# Patient Record
Sex: Female | Born: 1965
Health system: Southern US, Community
[De-identification: ages and names within clinical notes are randomized; demographics above are authoritative.]

## PROBLEM LIST (undated history)

## (undated) DIAGNOSIS — I2510 Atherosclerotic heart disease of native coronary artery without angina pectoris: Secondary | ICD-10-CM

## (undated) DIAGNOSIS — E785 Hyperlipidemia, unspecified: Secondary | ICD-10-CM

## (undated) DIAGNOSIS — K635 Polyp of colon: Secondary | ICD-10-CM

## (undated) DIAGNOSIS — I10 Essential (primary) hypertension: Secondary | ICD-10-CM

## (undated) DIAGNOSIS — R7303 Prediabetes: Secondary | ICD-10-CM

## (undated) DIAGNOSIS — T4145XA Adverse effect of unspecified anesthetic, initial encounter: Secondary | ICD-10-CM

## (undated) DIAGNOSIS — H209 Unspecified iridocyclitis: Secondary | ICD-10-CM

## (undated) HISTORY — PX: OOPHORECTOMY: SHX86

## (undated) HISTORY — PX: BREAST REDUCTION SURGERY: SHX8

## (undated) HISTORY — DX: Essential (primary) hypertension: I10

## (undated) HISTORY — PX: TOTAL ABDOMINAL HYSTERECTOMY: SHX209

## (undated) HISTORY — PX: OTHER SURGICAL HISTORY: SHX169

## (undated) HISTORY — PX: HERNIA REPAIR: SHX51

## (undated) HISTORY — DX: Hyperlipidemia, unspecified: E78.5

## (undated) HISTORY — DX: Polyp of colon: K63.5

## (undated) HISTORY — PX: CATARACT EXTRACTION W/ INTRAOCULAR LENS IMPLANT: SHX1309

## (undated) HISTORY — DX: Atherosclerotic heart disease of native coronary artery without angina pectoris: I25.10

## (undated) HISTORY — PX: CHOLECYSTECTOMY: SHX55

## (undated) HISTORY — PX: EYE MUSCLE SURGERY: SHX370

---

## 1998-03-11 ENCOUNTER — Ambulatory Visit (HOSPITAL_COMMUNITY): Admission: RE | Admit: 1998-03-11 | Discharge: 1998-03-11 | Payer: Self-pay

## 1998-04-02 HISTORY — PX: REDUCTION MAMMAPLASTY: SUR839

## 1998-09-27 ENCOUNTER — Other Ambulatory Visit: Admission: RE | Admit: 1998-09-27 | Discharge: 1998-09-27 | Payer: Self-pay | Admitting: Obstetrics and Gynecology

## 1999-02-13 ENCOUNTER — Ambulatory Visit (HOSPITAL_BASED_OUTPATIENT_CLINIC_OR_DEPARTMENT_OTHER): Admission: RE | Admit: 1999-02-13 | Discharge: 1999-02-14 | Payer: Self-pay | Admitting: Specialist

## 1999-12-25 ENCOUNTER — Ambulatory Visit (HOSPITAL_BASED_OUTPATIENT_CLINIC_OR_DEPARTMENT_OTHER): Admission: RE | Admit: 1999-12-25 | Discharge: 1999-12-26 | Payer: Self-pay | Admitting: Specialist

## 2000-01-19 ENCOUNTER — Encounter: Payer: Self-pay | Admitting: Gastroenterology

## 2000-01-19 ENCOUNTER — Ambulatory Visit (HOSPITAL_COMMUNITY): Admission: RE | Admit: 2000-01-19 | Discharge: 2000-01-19 | Payer: Self-pay | Admitting: Gastroenterology

## 2000-02-01 ENCOUNTER — Ambulatory Visit (HOSPITAL_COMMUNITY): Admission: RE | Admit: 2000-02-01 | Discharge: 2000-02-01 | Payer: Self-pay | Admitting: Internal Medicine

## 2000-02-01 ENCOUNTER — Encounter: Payer: Self-pay | Admitting: Internal Medicine

## 2000-02-02 ENCOUNTER — Ambulatory Visit (HOSPITAL_COMMUNITY): Admission: RE | Admit: 2000-02-02 | Discharge: 2000-02-02 | Payer: Self-pay | Admitting: Internal Medicine

## 2000-02-02 ENCOUNTER — Encounter: Payer: Self-pay | Admitting: Internal Medicine

## 2000-05-02 ENCOUNTER — Encounter: Payer: Self-pay | Admitting: Internal Medicine

## 2000-05-02 ENCOUNTER — Ambulatory Visit (HOSPITAL_COMMUNITY): Admission: RE | Admit: 2000-05-02 | Discharge: 2000-05-02 | Payer: Self-pay | Admitting: Internal Medicine

## 2000-05-31 ENCOUNTER — Ambulatory Visit (HOSPITAL_BASED_OUTPATIENT_CLINIC_OR_DEPARTMENT_OTHER): Admission: RE | Admit: 2000-05-31 | Discharge: 2000-05-31 | Payer: Self-pay | Admitting: Surgery

## 2000-12-10 ENCOUNTER — Encounter: Payer: Self-pay | Admitting: Internal Medicine

## 2000-12-10 ENCOUNTER — Ambulatory Visit (HOSPITAL_COMMUNITY): Admission: RE | Admit: 2000-12-10 | Discharge: 2000-12-10 | Payer: Self-pay | Admitting: Internal Medicine

## 2001-01-03 ENCOUNTER — Other Ambulatory Visit: Admission: RE | Admit: 2001-01-03 | Discharge: 2001-01-03 | Payer: Self-pay | Admitting: *Deleted

## 2001-01-27 ENCOUNTER — Encounter: Payer: Self-pay | Admitting: *Deleted

## 2001-01-28 ENCOUNTER — Inpatient Hospital Stay (HOSPITAL_COMMUNITY): Admission: RE | Admit: 2001-01-28 | Discharge: 2001-01-30 | Payer: Self-pay | Admitting: *Deleted

## 2001-05-13 ENCOUNTER — Encounter: Payer: Self-pay | Admitting: Urology

## 2001-05-13 ENCOUNTER — Ambulatory Visit (HOSPITAL_COMMUNITY): Admission: RE | Admit: 2001-05-13 | Discharge: 2001-05-13 | Payer: Self-pay | Admitting: Urology

## 2001-05-30 ENCOUNTER — Ambulatory Visit (HOSPITAL_BASED_OUTPATIENT_CLINIC_OR_DEPARTMENT_OTHER): Admission: RE | Admit: 2001-05-30 | Discharge: 2001-05-30 | Payer: Self-pay | Admitting: Urology

## 2001-09-08 ENCOUNTER — Ambulatory Visit (HOSPITAL_COMMUNITY): Admission: RE | Admit: 2001-09-08 | Discharge: 2001-09-08 | Payer: Self-pay | Admitting: Obstetrics and Gynecology

## 2001-09-08 ENCOUNTER — Encounter: Payer: Self-pay | Admitting: Obstetrics and Gynecology

## 2012-04-02 DIAGNOSIS — T8859XA Other complications of anesthesia, initial encounter: Secondary | ICD-10-CM

## 2012-04-02 HISTORY — DX: Other complications of anesthesia, initial encounter: T88.59XA

## 2012-12-24 ENCOUNTER — Ambulatory Visit: Payer: Self-pay | Admitting: Internal Medicine

## 2013-01-13 ENCOUNTER — Ambulatory Visit: Payer: Self-pay | Admitting: Obstetrics and Gynecology

## 2013-02-06 ENCOUNTER — Inpatient Hospital Stay: Payer: Self-pay | Admitting: Obstetrics and Gynecology

## 2013-02-06 LAB — BASIC METABOLIC PANEL
Anion Gap: 4 — ABNORMAL LOW (ref 7–16)
BUN: 11 mg/dL (ref 7–18)
Calcium, Total: 8.4 mg/dL — ABNORMAL LOW (ref 8.5–10.1)
Chloride: 105 mmol/L (ref 98–107)
Co2: 29 mmol/L (ref 21–32)
EGFR (African American): >60
EGFR (Non-African Amer.): >60
Glucose: 99 mg/dL (ref 65–99)
Sodium: 138 mmol/L (ref 136–145)

## 2013-02-06 LAB — CBC
HCT: 42.4 % (ref 35.0–47.0)
MCHC: 35 g/dL (ref 32.0–36.0)
MCV: 90 fL (ref 80–100)
Platelet: 271 10*3/uL (ref 150–440)

## 2013-02-07 LAB — BASIC METABOLIC PANEL
Anion Gap: 3 — ABNORMAL LOW (ref 7–16)
Calcium, Total: 8.1 mg/dL — ABNORMAL LOW (ref 8.5–10.1)
EGFR (African American): >60
EGFR (Non-African Amer.): >60
Potassium: 3.7 mmol/L (ref 3.5–5.1)

## 2013-02-07 LAB — CBC WITH DIFFERENTIAL/PLATELET
Basophil #: 0 10*3/uL (ref 0.0–0.1)
Basophil %: 0.3 %
Eosinophil #: 0 10*3/uL (ref 0.0–0.7)
Eosinophil %: 0.1 %
HGB: 13.1 g/dL (ref 12.0–16.0)
Lymphocyte #: 1.1 10*3/uL (ref 1.0–3.6)
MCHC: 34.6 g/dL (ref 32.0–36.0)
MCV: 90 fL (ref 80–100)
Monocyte #: 1.7 x10 3/mm — ABNORMAL HIGH (ref 0.2–0.9)
Neutrophil %: 81.7 %
RDW: 12.9 % (ref 11.5–14.5)
WBC: 15.9 10*3/uL — ABNORMAL HIGH (ref 3.6–11.0)

## 2013-02-17 ENCOUNTER — Inpatient Hospital Stay: Payer: Self-pay | Admitting: Surgery

## 2013-02-17 LAB — BASIC METABOLIC PANEL
Anion Gap: 8 (ref 7–16)
Calcium, Total: 8.9 mg/dL (ref 8.5–10.1)
Chloride: 95 mmol/L — ABNORMAL LOW (ref 98–107)
Co2: 33 mmol/L — ABNORMAL HIGH (ref 21–32)
Creatinine: 0.92 mg/dL (ref 0.60–1.30)
EGFR (African American): >60
Glucose: 147 mg/dL — ABNORMAL HIGH (ref 65–99)
Potassium: 2.7 mmol/L — ABNORMAL LOW (ref 3.5–5.1)
Sodium: 136 mmol/L (ref 136–145)

## 2013-02-17 LAB — CBC WITH DIFFERENTIAL/PLATELET
Basophil %: 0.8 %
Eosinophil %: 1.1 %
HCT: 39.7 % (ref 35.0–47.0)
Lymphocyte #: 1.6 10*3/uL (ref 1.0–3.6)
Lymphocyte %: 7 %
MCHC: 34.4 g/dL (ref 32.0–36.0)
Monocyte #: 1.7 x10 3/mm — ABNORMAL HIGH (ref 0.2–0.9)
Monocyte %: 7.2 %
Neutrophil %: 83.9 %
Platelet: 476 10*3/uL — ABNORMAL HIGH (ref 150–440)
RBC: 4.45 10*6/uL (ref 3.80–5.20)
RDW: 12.9 % (ref 11.5–14.5)
WBC: 23.1 10*3/uL — ABNORMAL HIGH (ref 3.6–11.0)

## 2013-02-17 LAB — URINALYSIS, COMPLETE
Glucose,UR: NEGATIVE mg/dL (ref 0–75)
Granular Cast: 2
Hyaline Cast: 35
Nitrite: NEGATIVE
Protein: 100
RBC,UR: 5 /HPF (ref 0–5)
Squamous Epithelial: 29
Transitional Epi: 2

## 2013-02-18 LAB — CBC WITH DIFFERENTIAL/PLATELET
Basophil #: 0.1 10*3/uL (ref 0.0–0.1)
Eosinophil #: 0.4 10*3/uL (ref 0.0–0.7)
HCT: 32.6 % — ABNORMAL LOW (ref 35.0–47.0)
HGB: 11.2 g/dL — ABNORMAL LOW (ref 12.0–16.0)
Lymphocyte #: 1.8 10*3/uL (ref 1.0–3.6)
MCH: 30.6 pg (ref 26.0–34.0)
MCV: 89 fL (ref 80–100)
Monocyte #: 1.7 x10 3/mm — ABNORMAL HIGH (ref 0.2–0.9)
Monocyte %: 9.9 %
Platelet: 378 10*3/uL (ref 150–440)
RDW: 13.2 % (ref 11.5–14.5)

## 2013-02-18 LAB — POTASSIUM: Potassium: 2.5 mmol/L — CL (ref 3.5–5.1)

## 2013-02-19 LAB — POTASSIUM: Potassium: 3.6 mmol/L (ref 3.5–5.1)

## 2013-02-19 LAB — URINE CULTURE

## 2013-02-21 LAB — BASIC METABOLIC PANEL
Anion Gap: 0 — ABNORMAL LOW (ref 7–16)
Anion Gap: 7 (ref 7–16)
BUN: 7 mg/dL (ref 7–18)
Calcium, Total: 7.2 mg/dL — ABNORMAL LOW (ref 8.5–10.1)
Chloride: 107 mmol/L (ref 98–107)
Co2: 25 mmol/L (ref 21–32)
Creatinine: 0.51 mg/dL — ABNORMAL LOW (ref 0.60–1.30)
Creatinine: 0.54 mg/dL — ABNORMAL LOW (ref 0.60–1.30)
EGFR (African American): >60
EGFR (African American): >60
EGFR (Non-African Amer.): >60
EGFR (Non-African Amer.): >60
Glucose: 82 mg/dL (ref 65–99)
Osmolality: 275 (ref 275–301)
Potassium: 7.8 mmol/L (ref 3.5–5.1)
Potassium: 4.2 mmol/L (ref 3.5–5.1)
Sodium: 132 mmol/L — ABNORMAL LOW (ref 136–145)

## 2013-02-21 LAB — PLATELET COUNT: Platelet: 419 10*3/uL (ref 150–440)

## 2013-03-25 ENCOUNTER — Ambulatory Visit: Payer: Self-pay | Admitting: Surgery

## 2013-05-16 DIAGNOSIS — L7682 Other postprocedural complications of skin and subcutaneous tissue: Secondary | ICD-10-CM | POA: Insufficient documentation

## 2013-09-23 ENCOUNTER — Ambulatory Visit: Payer: Self-pay | Admitting: Internal Medicine

## 2013-11-13 DIAGNOSIS — K432 Incisional hernia without obstruction or gangrene: Secondary | ICD-10-CM | POA: Insufficient documentation

## 2014-02-03 ENCOUNTER — Ambulatory Visit: Payer: Self-pay | Admitting: Internal Medicine

## 2014-04-20 ENCOUNTER — Ambulatory Visit: Payer: Self-pay | Admitting: Cardiovascular Disease

## 2014-05-06 ENCOUNTER — Encounter: Payer: Self-pay | Admitting: Cardiovascular Disease

## 2014-05-06 ENCOUNTER — Ambulatory Visit (INDEPENDENT_AMBULATORY_CARE_PROVIDER_SITE_OTHER): Payer: 59 | Admitting: Cardiovascular Disease

## 2014-05-06 ENCOUNTER — Encounter (INDEPENDENT_AMBULATORY_CARE_PROVIDER_SITE_OTHER): Payer: Self-pay

## 2014-05-06 VITALS — BP 132/84 | HR 67 | Ht 64.0 in | Wt 211.0 lb

## 2014-05-06 DIAGNOSIS — R079 Chest pain, unspecified: Secondary | ICD-10-CM | POA: Insufficient documentation

## 2014-05-06 DIAGNOSIS — Z01812 Encounter for preprocedural laboratory examination: Secondary | ICD-10-CM

## 2014-05-06 DIAGNOSIS — I10 Essential (primary) hypertension: Secondary | ICD-10-CM | POA: Insufficient documentation

## 2014-05-06 DIAGNOSIS — E785 Hyperlipidemia, unspecified: Secondary | ICD-10-CM | POA: Insufficient documentation

## 2014-05-06 NOTE — Assessment & Plan Note (Signed)
Recent lipid profile was not optimal and Crestor. Recommend a target LDL of less than 100 and if she does have obstructive coronary artery disease recommend a target of less than 70.

## 2014-05-06 NOTE — Progress Notes (Signed)
Primary care physician:Dr. Masoud.   HPI  This is a pleasant 49 year old female who was referred for evaluation of chest pain. She has no previous cardiac history. She has known history of hypertension, hyperlipidemia, borderline diabetes and strong family history of premature coronary artery disease. She has been having intermittent chest pain over the last few weeks described as tightness feeling both at rest and with physical activities. This has been associated with significant exertional dyspnea. She underwent a stress echocardiogram. However, this was limited by hypertensive response to exercise with a blood pressure of 200/100. She also had significant limiting chest tightness. Peak heart rate was 106 only. Echo images showed normal LV systolic function with no evidence of ischemia at low level of exercise.  Allergies  Allergen Reactions  . Ceftriaxone Rash  . Hydromorphone Rash  . Latex Rash  . Olmesartan Palpitations     No current outpatient prescriptions on file prior to visit.   No current facility-administered medications on file prior to visit.     Past Medical History  Diagnosis Date  . Hypertension   . Hyperlipidemia      Past Surgical History  Procedure Laterality Date  . Cesarean section      x3  . Abdominal plasty    . Breast reduction surgery    . Total abdominal hysterectomy    . Hernia repair    . Oophorectomy    . Eye muscle surgery    . Cholecystectomy       Family History  Problem Relation Age of Onset  . Heart disease Mother   . Heart attack Mother   . Pulmonary embolism Mother   . Heart attack Maternal Uncle   . Heart disease Maternal Uncle   . Heart attack Paternal Uncle   . Heart attack Maternal Uncle   . Heart disease Maternal Uncle   . Heart attack Maternal Uncle   . Heart disease Maternal Uncle   . Heart attack Maternal Uncle   . Heart disease Maternal Uncle   . Heart attack Maternal Uncle   . Heart disease Maternal Uncle     . Heart attack Maternal Uncle   . Heart disease Maternal Uncle      History   Social History  . Marital Status: Divorced    Spouse Name: N/A    Number of Children: N/A  . Years of Education: N/A   Occupational History  . Not on file.   Social History Main Topics  . Smoking status: Never Smoker   . Smokeless tobacco: Not on file  . Alcohol Use: No  . Drug Use: No  . Sexual Activity: Not on file   Other Topics Concern  . Not on file   Social History Narrative  . No narrative on file     ROS A 10 point review of system was performed. It is negative other than that mentioned in the history of present illness.  PHYSICAL EXAM   BP 132/84 mmHg  Pulse 67  Ht 5\' 4"  (1.626 m)  Wt 211 lb (95.709 kg)  BMI 36.20 kg/m2 Constitutional: She is oriented to person, place, and time. She appears well-developed and well-nourished. No distress.  HENT: No nasal discharge.  Head: Normocephalic and atraumatic.  Eyes: Pupils are equal and round. No discharge.  Neck: Normal range of motion. Neck supple. No JVD present. No thyromegaly present.  Cardiovascular: Normal rate, regular rhythm, normal heart sounds. Exam reveals no gallop and no friction rub. No murmur heard.  Pulmonary/Chest: Effort normal and breath sounds normal. No stridor. No respiratory distress. She has no wheezes. She has no rales. She exhibits no tenderness.  Abdominal: Soft. Bowel sounds are normal. She exhibits no distension. There is no tenderness. There is no rebound and no guarding.  Musculoskeletal: Normal range of motion. She exhibits no edema and no tenderness.  Neurological: She is alert and oriented to person, place, and time. Coordination normal.  Skin: Skin is warm and dry. No rash noted. She is not diaphoretic. No erythema. No pallor.  Psychiatric: She has a normal mood and affect. Her behavior is normal. Judgment and thought content normal.     FUW:TKTCC  Rhythm  WITHIN NORMAL LIMITS   ASSESSMENT  AND PLAN

## 2014-05-06 NOTE — Patient Instructions (Addendum)
[facility] Cardiac Cath Instructions   You are scheduled for a Cardiac Cath on:_______2/5/16__________________  Please arrive at _0730______am on the day of your procedure  You will need to pre-register prior to the day of your procedure.  Enter through the [employer] at [facility].  Registration is the first desk on your right.  Please take the procedure order we have given you in order to be registered appropriately  Do not eat/drink anything after midnight  Someone will need to drive you home  It is recommended someone be with you for the first 24 hours after your procedure  Wear clothes that are easy to get on/off and wear slip on shoes if possible   Medications bring a current list of all medications with you  __x_ You may take all of your medications the morning of your procedure with enough water to swallow safely    Day of your procedure: Arrive at the [place] entrance.  Free valet service is available.  After entering the [place] please check-in at the registration desk (1st desk on your right) to receive your armband. After receiving your armband someone will escort you to the cardiac cath/special procedures waiting area.  The usual length of stay after your procedure is about 2 to 3 hours.  This can vary.  If you have any questions, please call our office at [phone], or you may call the cardiac cath lab at [facility] directly at [phone]   Your physician recommends that you have labs today: CBC  BMP INR

## 2014-05-06 NOTE — Assessment & Plan Note (Signed)
The patient's symptoms of exertional chest tightness and shortness of breath are worrisome for class III angina in spite of treatment with a beta blocker. Recent stress echocardiogram was unremarkable. However, this was suboptimal due to low peak heart rate. The stress test was limited by chest pain and hypertensive response to exercise. Given current symptoms and multiple risk factors for coronary artery disease, I believe the best option is proceeding with cardiac catheterization and possible coronary intervention. Risks, benefits and alternatives were discussed with the patient.

## 2014-05-06 NOTE — Assessment & Plan Note (Signed)
Blood pressure is reasonably controlled on Toprol.

## 2014-05-07 ENCOUNTER — Ambulatory Visit: Payer: Self-pay | Admitting: Cardiovascular Disease

## 2014-05-07 ENCOUNTER — Telehealth: Payer: Self-pay | Admitting: *Deleted

## 2014-05-07 DIAGNOSIS — I2510 Atherosclerotic heart disease of native coronary artery without angina pectoris: Secondary | ICD-10-CM

## 2014-05-07 HISTORY — PX: CARDIAC CATHETERIZATION: SHX172

## 2014-05-07 LAB — CBC WITH DIFFERENTIAL/PLATELET
Basophils Absolute: 0 10*3/uL (ref 0.0–0.2)
Basos: 0 %
Eos: 3 %
Eosinophils Absolute: 0.3 10*3/uL (ref 0.0–0.4)
HCT: 41.9 % (ref 34.0–46.6)
Hemoglobin: 14.4 g/dL (ref 11.1–15.9)
Immature Grans (Abs): 0 10*3/uL (ref 0.0–0.1)
Immature Granulocytes: 0 %
Lymphocytes Absolute: 2.5 10*3/uL (ref 0.7–3.1)
Lymphs: 24 %
MCH: 30.7 pg (ref 26.6–33.0)
MCHC: 34.4 g/dL (ref 31.5–35.7)
MCV: 89 fL (ref 79–97)
Monocytes Absolute: 0.9 10*3/uL (ref 0.1–0.9)
Monocytes: 9 %
Neutrophils Absolute: 6.7 10*3/uL (ref 1.4–7.0)
Neutrophils Relative %: 64 %
Platelets: 310 10*3/uL (ref 150–379)
RBC: 4.69 x10E6/uL (ref 3.77–5.28)
RDW: 14.6 % (ref 12.3–15.4)
WBC: 10.5 10*3/uL (ref 3.4–10.8)

## 2014-05-07 LAB — PROTIME-INR
INR: 1 (ref 0.8–1.2)
Prothrombin Time: 10.1 s (ref 9.1–12.0)

## 2014-05-07 LAB — BASIC METABOLIC PANEL
BUN/Creatinine Ratio: 17 (ref 9–23)
BUN: 11 mg/dL (ref 6–24)
CO2: 21 mmol/L (ref 18–29)
Calcium: 9.6 mg/dL (ref 8.7–10.2)
Chloride: 106 mmol/L (ref 97–108)
Creatinine, Ser: 0.66 mg/dL (ref 0.57–1.00)
GFR calc Af Amer: 120 mL/min/{1.73_m2} (ref >59)
GFR calc non Af Amer: 104 mL/min/{1.73_m2} (ref >59)
Glucose: 117 mg/dL — ABNORMAL HIGH (ref 65–99)
Potassium: 4.8 mmol/L (ref 3.5–5.2)
Sodium: 142 mmol/L (ref 134–144)

## 2014-05-07 MED ORDER — AMLODIPINE BESYLATE 2.5 MG PO TABS: 2.5000 mg | ORAL_TABLET | Freq: Every day | ORAL | Status: DC

## 2014-05-07 NOTE — Telephone Encounter (Signed)
[place] calling stating that Dr Fletcher Anon did cath on this patient and stated to patient that he wants her to start amlodipine  But the question is,  Is he going to send it in, or will she get it when he see's her. She is more confused.  Dr Fletcher Anon did not say when or how would patient start.   Please call Juliann Pulse back.

## 2014-05-07 NOTE — Telephone Encounter (Signed)
LVM to inform patient Dr. Fletcher Anon sent Norvasc 2.5 mg once daily to her pharmacy to be filled

## 2014-05-24 ENCOUNTER — Encounter: Payer: 59 | Admitting: Cardiovascular Disease

## 2014-05-24 ENCOUNTER — Encounter: Payer: Self-pay | Admitting: Cardiovascular Disease

## 2014-06-03 ENCOUNTER — Ambulatory Visit (INDEPENDENT_AMBULATORY_CARE_PROVIDER_SITE_OTHER): Payer: 59 | Admitting: Cardiovascular Disease

## 2014-06-03 ENCOUNTER — Encounter: Payer: Self-pay | Admitting: Cardiovascular Disease

## 2014-06-03 VITALS — BP 118/82 | HR 77 | Ht 64.0 in | Wt 213.2 lb

## 2014-06-03 DIAGNOSIS — E785 Hyperlipidemia, unspecified: Secondary | ICD-10-CM

## 2014-06-03 DIAGNOSIS — I25118 Atherosclerotic heart disease of native coronary artery with other forms of angina pectoris: Secondary | ICD-10-CM

## 2014-06-03 DIAGNOSIS — I2510 Atherosclerotic heart disease of native coronary artery without angina pectoris: Secondary | ICD-10-CM | POA: Insufficient documentation

## 2014-06-03 DIAGNOSIS — Z9889 Other specified postprocedural states: Secondary | ICD-10-CM

## 2014-06-03 DIAGNOSIS — I10 Essential (primary) hypertension: Secondary | ICD-10-CM

## 2014-06-03 MED ORDER — CARVEDILOL 3.125 MG PO TABS: 3.1250 mg | ORAL_TABLET | Freq: Two times a day (BID) | ORAL | Status: DC

## 2014-06-03 NOTE — Progress Notes (Signed)
Primary care physician:Dr. Masoud.   HPI  This is a pleasant 49 year old female who is here today for a follow-up visit regarding chest pain and coronary artery disease. She has known history of hypertension, hyperlipidemia, borderline diabetes and strong family history of premature coronary artery disease. She was seen recently for intermittent chest pain  both at rest and with physical activities.  She underwent a stress echocardiogram. However, this was limited by hypertensive response to exercise with a blood pressure of 200/100. She also had significant limiting chest tightness. Peak heart rate was 106 only. Echo images showed normal LV systolic function with no evidence of ischemia at low level of exercise. I proceeded with cardiac catheterization via the right radial which showed an 80% discrete mid LAD stenosis at the origin of first diagonal branch. The LAD was very small after the diagonal branch less than 2.5 mm. Ejection fraction was 65% with mildly elevated left ventricular end-diastolic pressure. I recommended medical therapy. I added amlodipine with subsequent improvement in blood pressure. She reports feeling tired and fatigued when she takes Toprol. Otherwise she denies recurrent chest pain.   Allergies  Allergen Reactions  . Ceftriaxone Rash  . Hydromorphone Rash  . Latex Rash  . Olmesartan Palpitations     Current Outpatient Prescriptions on File Prior to Visit  Medication Sig Dispense Refill  . amLODipine (NORVASC) 2.5 MG tablet Take 1 tablet (2.5 mg total) by mouth daily. 30 tablet 6  . aspirin EC 81 MG tablet Take 81 mg by mouth daily.     . Multiple Vitamin (MULTI-VITAMINS) TABS Take 1 tablet by mouth daily.     Marland Kitchen omeprazole (PRILOSEC) 20 MG capsule Take 1 capsule by mouth daily.     . rosuvastatin (CRESTOR) 10 MG tablet Take 10 mg by mouth daily.    Marland Kitchen zolpidem (AMBIEN) 5 MG tablet Take 5 mg by mouth as needed.     No current facility-administered medications on  file prior to visit.     Past Medical History  Diagnosis Date  . Hypertension   . Hyperlipidemia   . Coronary artery disease     Cardiac catheterization in February 2016 showed an 80% discrete mid LAD stenosis at the origin of first diagonal branch. The LAD was very small after the diagonal branch less than 2.5 mm. Ejection fraction was 65% with mildly elevated left ventricular end-diastolic pressure.     Past Surgical History  Procedure Laterality Date  . Cesarean section      x3  . Abdominal plasty    . Breast reduction surgery    . Total abdominal hysterectomy    . Hernia repair    . Oophorectomy    . Eye muscle surgery    . Cholecystectomy    . Cardiac catheterization  05/07/2014    [facility]     Family History  Problem Relation Age of Onset  . Heart disease Mother   . Heart attack Mother   . Pulmonary embolism Mother   . Heart attack Maternal Uncle   . Heart disease Maternal Uncle   . Heart attack Paternal Uncle   . Heart attack Maternal Uncle   . Heart disease Maternal Uncle   . Heart attack Maternal Uncle   . Heart disease Maternal Uncle   . Heart attack Maternal Uncle   . Heart disease Maternal Uncle   . Heart attack Maternal Uncle   . Heart disease Maternal Uncle   . Heart attack Maternal Uncle   .  Heart disease Maternal Uncle      History   Social History  . Marital Status: Divorced    Spouse Name: N/A  . Number of Children: N/A  . Years of Education: N/A   Occupational History  . Not on file.   Social History Main Topics  . Smoking status: Never Smoker   . Smokeless tobacco: Not on file  . Alcohol Use: No  . Drug Use: No  . Sexual Activity: Not on file   Other Topics Concern  . Not on file   Social History Narrative     ROS A 10 point review of system was performed. It is negative other than that mentioned in the history of present illness.  PHYSICAL EXAM   BP 118/82 mmHg  Pulse 77  Ht 5\' 4"  (1.626 m)  Wt 213 lb 4 oz (96.73 kg)   BMI 36.59 kg/m2 Constitutional: She is oriented to person, place, and time. She appears well-developed and well-nourished. No distress.  HENT: No nasal discharge.  Head: Normocephalic and atraumatic.  Eyes: Pupils are equal and round. No discharge.  Neck: Normal range of motion. Neck supple. No JVD present. No thyromegaly present.  Cardiovascular: Normal rate, regular rhythm, normal heart sounds. Exam reveals no gallop and no friction rub. No murmur heard.  Pulmonary/Chest: Effort normal and breath sounds normal. No stridor. No respiratory distress. She has no wheezes. She has no rales. She exhibits no tenderness.  Abdominal: Soft. Bowel sounds are normal. She exhibits no distension. There is no tenderness. There is no rebound and no guarding.  Musculoskeletal: Normal range of motion. She exhibits no edema and no tenderness.  Neurological: She is alert and oriented to person, place, and time. Coordination normal.  Skin: Skin is warm and dry. No rash noted. She is not diaphoretic. No erythema. No pallor.  Psychiatric: She has a normal mood and affect. Her behavior is normal. Judgment and thought content normal.     WNU:UVOZD  Rhythm  WITHIN NORMAL LIMITS   ASSESSMENT AND PLAN

## 2014-06-03 NOTE — Assessment & Plan Note (Signed)
Blood pressure is now well controlled on current medications. Toprol was switched to carvedilol as outlined above.

## 2014-06-03 NOTE — Assessment & Plan Note (Signed)
Cardiac catheterization showed one-vessel coronary artery disease involving the mid LAD which was of small caliber after the stenosis. I recommended attempted medical therapy. Chest pain resolved after adding amlodipine. Given the reported fatigue with Toprol, I switched her to carvedilol 3.125 mg twice daily.

## 2014-06-03 NOTE — Patient Instructions (Signed)
Stop Metoprolol.  Start Carvedilol 3.125 mg twice daily.   Your physician wants you to follow-up in: 6 months.  You will receive a reminder letter in the mail two months in advance. If you don't receive a letter, please call our office to schedule the follow-up appointment.

## 2014-06-03 NOTE — Assessment & Plan Note (Signed)
Continue treatment with rosuvastatin with a target LDL of less than 70. 

## 2014-06-24 ENCOUNTER — Telehealth: Payer: Self-pay | Admitting: *Deleted

## 2014-06-24 NOTE — Telephone Encounter (Signed)
Patient stated that she does not think she can take the Coreg  She is getting side effects, giving her light chest pains She states she does not want to be on a betablocker She is asking if we can change the medication.  Also states she has gained 16 pounds  LVM to inform patient that I received her message

## 2014-06-24 NOTE — Telephone Encounter (Signed)
Pt calling stating that the coreg that doctor put her on she can't take it She is getting side effects, giving her light chest pains   She states she does not want to be on a betablocker She says she does not have any  She is asking if we can change the medication.  Works for Dr Dellia Beckwith  Stated since he did not know where the blockage is, that dr Fletcher Anon would be better at changing the medication.  Also states she has gained 16 pounds

## 2014-06-28 NOTE — Telephone Encounter (Signed)
Informed patient of Dr. Jacklynn Ganong response  Patient will monitor blood pressure and call with results

## 2014-06-28 NOTE — Telephone Encounter (Signed)
Stop Coreg. Continue other medications. Monitor BP. If BP goes up , we can increase Amlodipine.

## 2014-07-23 NOTE — H&P (Signed)
PATIENT NAME:  Beverly Washington, Beverly Washington MR#:  [id number] DATE OF BIRTH:  [birth year]  DATE OF ADMISSION:  02/17/2013  CHIEF COMPLAINT: Abdominal wall infection, status post laparoscopic adhesiolysis and minilaparotomy.   HISTORY OF PRESENT ILLNESS: The patient is a 49 year old white female, para 3-0-1-3, with past history of multiple abdominal wall surgeries, who presents 1 week postoperative from laparoscopic adhesiolysis and minilaparotomy with LSO for suspected ovarian torsion, who presents now for aggressive antibiotic therapy for abdominal wall infection.   The patient had significant history notable for cesarean sections x3, total abdominal hysterectomy, bilateral tubal ligation, abdominoplasty and 2 hernia repairs with mesh in the past. Over the past 72 hours, the patient had developed drainage from her abdominal incision which was brownish, serous and malodorous. She denies fevers, chills or sweats. She denies nausea, vomiting, diarrhea. A white blood cell count obtained on November 17 was 18.4. On evaluation today, the patient appears to have more induration of her abdominal wall along with the persistent malodorous drainage. The patient remains afebrile. She is now admitted for IV antibiotic therapy as well as surgical consultation for assessment for the abdominal wall infection due to the patient's past history of having mesh placed for hernia repairs. Dr. Chauncey Reading previously assisted on the surgery 1 week ago. See his note for details.   PAST MEDICAL HISTORY:  1. Hypertension.  2. Obesity.   PAST SURGICAL HISTORY:  1. Cesarean section in 1984, 1988, 1994.  2. Cholecystectomy in 1989.  3. Tubal ligation in 1994.  4. Total abdominal hysterectomy in 2000. 5. Bladder sling in 2001.  6. Hernia repair in 2004.   PAST OBSTETRIC HISTORY: Para 3-0-1-3.   FAMILY HISTORY: Negative for cancer of the breast, colon or ovary.    SOCIAL HISTORY: The patient does not smoke, does not drink, does not  use drugs.  CURRENT MEDICATIONS:  1. Hydrochlorothiazide 12.5 mg daily.  2. Lisinopril 10 mg daily. 3. Dilaudid 2 to 4 mg q.6 hours p.r.n. pain. 4. Motrin 800 mg t.i.d.  5. Colace 100 mg b.i.d.   DRUG ALLERGIES: None.   PHYSICAL EXAMINATION:  GENERAL: The patient is a pleasant well-appearing white female in no acute distress. She is alert and oriented.  OROPHARYNX: Clear.  NECK: Supple, without thyromegaly or adenopathy.  LUNGS: Clear.  HEART: Regular rate and rhythm without murmur.  ABDOMEN: Soft. Mildly tender in the region of her midline incision. There is a midline incision that is notable for a defect in the inferior third of her incision measuring approximately 3 cm. The wound was packed. There is an area of induration approximately 6 x 8 cm to the lateral aspect of the wound. This is of the subcutaneous tissues and fascia. On probing, there is no pocket of fluid on the right side of the abdomen. There is a small tract noted on the left side of the wound on probing with Q-tip. There is erythema peri-incisionally.  BACK: Without CVA tenderness.  PELVIC: Exam is deferred.  EXTREMITIES: Without clubbing, cyanosis or edema.   IMPRESSION:  1. One week status post laparoscopic adhesiolysis and exploratory laparotomy with left salpingo-oophorectomy for suspected ovarian torsion. Pathology was notable for a simple cyst. 2. Postoperative abdominal wall wound infection.  3. History of surgical mesh used for hernia repair.   PLAN: 1. Admit for IV antibiotic therapy. Triple antibiotics with ampicillin, gentamicin and clindamycin will be started.  2. [facility] is consulted for assessment of wound ASAP today.  3. CBC, basic metabolic panel ordered.  ____________________________ Alanda Slim Penda Venturi, MD mad:lb D: 02/17/2013 12:32:39 ET T: 02/17/2013 12:45:21 ET JOB#: [id number]  cc: Hassell Done A. Amer Alcindor, MD, <Dictator> [facility] Alanda Slim Amarii Bordas MD ELECTRONICALLY  SIGNED 02/17/2013 22:07

## 2014-07-23 NOTE — Consult Note (Signed)
PATIENT NAME:  Beverly Washington, Beverly Washington MR#:  [id number] DATE OF BIRTH:  06/25/1965  DATE OF CONSULTATION:  03/25/2013  REFERRING PHYSICIAN:   CONSULTING PHYSICIAN:  Rodena Goldmann III, MD  PRIMARY COMPLAINT: Abdominal pain with drainage from her previous wound.   BRIEF HISTORY: [name] is a 49 year old woman who underwent a minilaparotomy, lysis of adhesions and ovarian cyst drainage procedure by the gynecology service in early November of 2014. The procedure was complicated by an extensive dissection and some mild bleeding. The general surgery service was consulted. Dr. Chauncey Reading assisted in her operative management.  She had had a previous ventral hernia repair with underlay and overlay mesh. The mesh had been divided to allow access to the abdomen on the minilaparotomy. Wound closure was accomplished without difficulty. She developed a wound infection postoperatively, re-presented to the hospital where the wound was opened, drained and a wound VAC placed but there was no evidence of any mesh infection at the time of surgery. She has had multiple wound changes but has continued to have purulent drainage from that area and now has a single draining site with foul-smelling creamy pus exiting the wound. She has had a previous abdominoplasty and a previous open cholecystectomy. She denies any fever but is having weakness, malaise. She is accompanied by her friend to the Emergency Room today. The remainder of her history and physical is well outlined in her previous admission note.    PHYSICAL EXAMINATION: GENERAL:  She is alert and comfortable, unhappy with the current circumstances but in no acute distress.  HEENT: Unremarkable. She has no scleral icterus. No pupillary abnormalities.  NECK: Supple. Nontender, with a midline trachea.  RESPIRATORY:  She has normal pulmonary excursion.  ABDOMINAL EXAM:  Reveals a creamy drainage from a lower midline hole. Does probe to about 4 cm.  A wick was  placed. Sterile dressings were applied.  EXTREMITIES: Lower extremity exam is unremarkable except for some mild edema.   IMAGING:  She obtained a CT scan prior to evaluation in the Emergency Room which demonstrated some inflammatory changes around the underlay mesh but no obvious abscess. There were several dots of air, which suggests a previous wound problem. There did not appear to be obvious bowel or intra-abdominal injury.   IMPRESSION: This woman appears to have persistent wound infection in the setting of 2 mesh hernia repairs. I would anticipate that the patient does have a mesh infection. Looking at the mesh during the original wound exploration, the overlay mesh appeared to be well incorporated into the would and it would be unusual to see infection in the well-incorporated mesh. I would be concerned, because of the extensive intra-abdominal dissection, that the posterior layer of mesh is the mesh that is involved with the infection. She will need likely an aggressive surgical procedure with debridement and removal of all the mesh that is available and unincorporated. She will likely need a temporary closure of her abdominal wall either with biologic mesh or Vicryl mesh. She will then need to consider a possible permanent hernia repair when the wound is clear. I have discussed this plan with her in detail. She certainly does not have any urgent indications for intervention at this time. We will make arrangements for [facility] and she is in agreement with this plan. At the present time, I do not think antibiotics are indicated as there is minimal erythema, she does not have any fever and I  am concerned about creating a resistant organism. We discussed other options for intervention. I do not think anyone in our practice has enough experience with this particular type of infection to be comfortable taking on this surgical procedure and she had some real  reservations about using our group for further surgery.   ____________________________ Micheline Maze, MD rle:cs D: 03/25/2013 14:09:00 ET T: 03/25/2013 14:28:38 ET JOB#: [id number]  cc: Micheline Maze, MD, <Dictator> Alanda Slim. DeFrancesco, MD Rodena Goldmann MD ELECTRONICALLY SIGNED 03/25/2013 17:30

## 2014-07-23 NOTE — Op Note (Signed)
PATIENT NAME:  GENISE, STRACK MR#:  [id number] DATE OF BIRTH:  March 12, 1966  INTRAOPERATIVE CONSULTATION   DATE OF PROCEDURE:  02/07/2013  ATTENDING PHYSICIAN: Harrell Gave A. Delorus Langwell, MD  PREOPERATIVE DIAGNOSIS: Per Dr. Thamas Jaegers note, chronic pelvic pain, ovarian cysts, possible ovarian torsion.   POSTOPERATIVE DIAGNOSIS: Dense pelvic adhesive disease, left adnexal mass, peritoneal inclusion cyst.   PROCEDURE PERFORMED: Intraoperative consultation for evaluation for hemostasis as well as for recommendations for abdominal closure.   ANESTHESIA: General.   INDICATION FOR CONSULTATION: Ms. Bouyer is a pleasant 49 year old who has had multiple abdominal surgeries and pelvic surgeries, who underwent laparoscopic adhesiolysis and exploratory laparotomy with Dr. Enzo Bi. I was called intraoperatively to assist and evaluate for hemostasis. Upon scrubbing, there was a large amount of omentum which had been taken down as well as a mesh which was required for entry into the abdomen by Dr. Enzo Bi. I had assisted him with a set of hands with the salpingo-oophorectomy. Following this, I carefully examined the abdomen, in particular, the abdominal wall and omentum and any visible areas, and found the wound to be very hemostatic. I also recommended, as the patient had a previous overlay mesh and a laparoscopic underlay mesh, to close the overlay mesh and abdominal wall en bloc, which was performed by Dr. Enzo Bi. I saw no obvious bowel injury. I saw no obvious bleeding at the time of discharge, and the wound closed well. See Dr. Thamas Jaegers note for description of entire procedure.   ____________________________ Glena Norfolk. Korben Carcione, MD cal:lb D: 02/07/2013 07:21:00 ET T: 02/07/2013 07:57:31 ET JOB#: [id number]  cc: Harrell Gave A. Quinn Bartling, MD, <Dictator> Floyde Parkins MD ELECTRONICALLY SIGNED 02/07/2013 14:48

## 2014-07-23 NOTE — Op Note (Signed)
PATIENT NAME:  Beverly Washington, Beverly Washington MR#:  [id number] DATE OF BIRTH:  May 27, 1965  DATE OF PROCEDURE:  02/19/2013  PREOPERATIVE DIAGNOSIS: Postoperative wound infection.   POSTOPERATIVE DIAGNOSIS: Postoperative wound infection.   OPERATION: Wound exploration and wound VAC placement.   ANESTHESIA: General.   SURGEON: Micheline Maze, M.D.   OPERATIVE PROCEDURE: With the patient in the supine position after the induction of appropriate general anesthesia, the patient's abdomen was prepped with Betadine and draped with sterile towels. Midline incision was opened the length of the previous incision. There was a large amount of dirty fat and brown fluid. There did not appear to be any active ongoing infection. There was exposed mesh in the depth of the wound, but it did not appear to be involved or infected and appeared to be well incorporated. The area was copiously irrigated. Wound VAC using white foam under the sinus tract and black foam in the main body of the wound. The patient was returned to the recovery room, having tolerated the procedure well. Sponge, instrument and needle counts were correct x 2 in the operating room.    ____________________________ Micheline Maze, MD rle:gb D: 02/19/2013 17:29:06 ET T: 02/19/2013 22:11:02 ET JOB#: [id number]  cc: Micheline Maze, MD, <Dictator> Alanda Slim. DeFrancesco, MD [name], MD Rodena Goldmann MD ELECTRONICALLY SIGNED 02/20/2013 17:50

## 2014-07-23 NOTE — Op Note (Signed)
PATIENT NAME:  SUMNER, BOESCH MR#:  [id number] DATE OF BIRTH:  [birth year]  DATE OF PROCEDURE:  02/21/2013  PREOPERATIVE DIAGNOSIS:  Postoperative wound infection.   POSTOPERATIVE DIAGNOSIS:  Postoperative wound infection.   OPERATION:  Wound VAC change.   ANESTHESIA:  General.   OPERATIVE PROCEDURE:  With the patient in the supine position after induction of appropriate general anesthesia, the patient's abdomen was prepped with Betadine and alcohol. The wound was investigated after having previously removed the wound VAC. There did not appear to be any evidence of any graft infection and the wound was cleaned. A white foam was placed into the undermined area. Ioban drapes placed across the abdomen as a base drape. Black foam was placed in the depths of the wound and the wound VAC secured without difficulty. The patient was returned to the Recovery Room having tolerated the procedure well. Sponge and needle counts were correct x 2 in the Operating Room.  ____________________________ Micheline Maze, MD rle:jm D: 02/21/2013 10:12:20 ET T: 02/21/2013 10:48:11 ET JOB#: [id number]  cc: Micheline Maze, MD, <Dictator> Alanda Slim. DeFrancesco, MD Rodena Goldmann MD ELECTRONICALLY SIGNED 03/03/2013 0:33

## 2014-07-23 NOTE — H&P (Signed)
Subjective/Chief Complaint Postoperative wound infection, history of multiple intraabdominal wound repairs   History of Present Illness Beverly Washington is a pleasant 49 yo F who underwent laparoscopic adhesiolysis with LSO for suspected ovarian torsion.  I was presents intraoperatively to assist with difficult exposure provided by prior hernia repairs x 2, evaluation for hemostasis and evaluation of prior hernia repairs and assistance with closure.  Per my investigation, she had a subfascial mesh which was held in place with metal tacks (more recent repair at [facility]) as well as what looked like a prolene overlay more cephalad.  The abdominal wall was closed en bloc incorporating the prolene mesh and rectus muscle and fascia into the repair.  The closure was unremarkable.  She had done well postopertatively and had her staples removed late last week.  When moving last weekend she had a rush of purulent fluid from her incision.  She has since then developed more periincisional cellulitis and persistent drainage.  Her wound is currently packed.  Otherwise has been doing well but is upset by the character of the drainage.   Past History Recent ex lap, LOA, LSO H/o prior incisional wall hernia repairs at [facility] and [facility] H/o c sxn x 3 H/o TAH H/o cholecystectomy H/o bladder sling HTN Obesity   Past Medical Health Hypertension   ALLERGIES:  Benicar: Other  Family and Social History:  Family History Negative   Social History negative tobacco, negative ETOH, negative Illicit drugs   Place of Living Home   Review of Systems:  Subjective/Chief Complaint Erythema/drainage from wound, leukocytosis   Fever/Chills No   Cough No   Sputum No   Abdominal Pain No   Diarrhea No   Constipation No   Nausea/Vomiting No   Dysuria No   Tolerating Diet Yes   Physical Exam:  GEN well developed, no acute distress   HEENT PERRL   RESP normal resp effort  no use  of accessory muscles   CARD regular rate   ABD denies tenderness  no hernia  Incision with periincisional erythema, induration to right of incision to right of wound, increased erythema at inferior aspect of wound., approx 3 cm defect at lower aspect of wound with some drainage, drainage is brownish but not feculent, unable to assess floor of wound and do not see obvious mesh, wound tracks inferiorly, no obvious purulence over area of induration   LYMPH negative neck, negative axillae   EXTR negative cyanosis/clubbing, negative edema   SKIN + perincisional erythema,   NEURO cranial nerves intact, follows commands, strength:, motor/sensory function intact   PSYCH A+O to time, place, person, good insight    Assessment/Admission Diagnosis Beverly Washington presents with postoperative wound infection s/p ex lap, LOA, LSO.  + leukocytosis.  tachycardia improved with IVF, abx.  Unable to assess fascia/mesh but at risk for infection of mesh requiring removal ultimately.  Skin changes not consistent with necrotizing soft tissue infection.   Plan Continue abx.  Will attempt to obtain records from prior surgeries to evaluate size of mesh, whether components were released etc.  Mesh appeared well incorporated during surgery and would likely result in significant damage to rectus and underlying fascia.  Will make NPO after midnight.  If not significantly improved may require wound exploration with possible mesh excision and vac placement   Electronic Signatures: Kennetha Pearman, Glena Norfolk (MD)  (Signed [phone] 21:14)  Authored: CHIEF COMPLAINT and HISTORY, ALLERGIES, FAMILY AND SOCIAL HISTORY, REVIEW OF SYSTEMS, PHYSICAL EXAM,  ASSESSMENT AND PLAN   Last Updated: 18-Nov-14 21:14 by Floyde Parkins (MD)

## 2014-07-23 NOTE — Op Note (Signed)
PATIENT NAME:  CHANNEL, PAPANDREA MR#:  [id number] DATE OF BIRTH:  09/02/1965  DATE OF PROCEDURE:  02/28/2013  PREOPERATIVE DIAGNOSIS: Postoperative wound infection.   POSTOPERATIVE DIAGNOSIS: Postoperative wound infection.   PROCEDURE PERFORMED: Wound VAC change.   ESTIMATED BLOOD LOSS:  5 mL   COMPLICATIONS: None.   SPECIMENS: None.   ANESTHESIA: None.  INDICATION FOR PROCEDURE: Ms. Michaelis is a pleasant 49 year old female with a recent exploratory laparotomy and left oophorectomy, who returned postoperatively with a wound infection. Her wound was opened and is currently being treated with wound VAC dressing changes. She presented for a routine wound VAC change.   DETAILS OF PROCEDURE: Informed consent was obtained. Ms. Gullo was brought to the operating room. Prior to anesthesia, she was laid supine on the operating room table. A timeout was performed, correctly identifying name, procedure to be performed, and operative site. The old VAC was taken off. There was minimal purulence and good granulation of subcu tissues. The fascia was not obviously examined, but there was no purulence. A new cut sponge was placed in its place. A suction vacuum was applied. There was good suction and no leak. The patient was then discharged home in satisfactory condition.   ____________________________ Glena Norfolk Jeslie Lowe, MD cal:mr D: 02/28/2013 19:07:00 ET T: 02/28/2013 20:21:49 ET JOB#: [id number] Harrell Gave A Loye Vento MD ELECTRONICALLY SIGNED 03/12/2013 11:35

## 2014-07-23 NOTE — Discharge Summary (Signed)
PATIENT NAME:  Beverly Washington, Beverly Washington MR#:  [id number] DATE OF BIRTH:  Feb 18, 1966  DATE OF ADMISSION:  02/17/2013 DATE OF DISCHARGE:  02/23/2013  BRIEF HISTORY: Beverly Washington is a 49 year old woman seen by the GYN service for lysis of adhesions problem. Intraoperatively, there was some concern about possible bowel injury and Dr. Chauncey Reading of our service assisted in the surgical procedure. The patient recovered uneventfully but following discharge home was noted to have a large wound infection. She was readmitted to the hospital. The wound opened and drained. She was taken to the operating room on November 20. The procedure was delayed because of her low potassium. She was taken to the operating room for wound exploration and wound VAC placement. There is no sign of any mesh infection and she did not have any evidence of undrained abscess. A wound VAC was placed. She recovered uneventfully. Her wound VAC was changed on 11/22 and she was discharged home on the 24th to be followed in the office in 7 to 10 days' time. Bathing, activity, and driving instructions were given the patient.   DISCHARGE MEDICATIONS: Include hydrochlorothiazide 12.5 mg once a day, lisinopril 10 mg once a day, fish oil 1000 mg twice a day, ibuprofen 800 mg 3 times a day, Dilaudid 2 mg every 4 hours for pain and Colace 100 mg b.i.d.   FINAL DISCHARGE DIAGNOSIS:  Postoperative wound infection.   ____________________________ Micheline Maze, MD rle:dp D: 03/10/2013 09:40:24 ET T: 03/10/2013 09:57:08 ET JOB#: [id number]  cc: Micheline Maze, MD, <Dictator> Alanda Slim. DeFrancesco, MD [name], MD  Rodena Goldmann MD ELECTRONICALLY SIGNED 03/11/2013 12:22

## 2014-07-23 NOTE — Op Note (Signed)
PATIENT NAME:  Beverly Washington, Beverly Washington MR#:  [id number] DATE OF BIRTH:  October 19, 1965  DATE OF PROCEDURE:  02/06/2013  PREOPERATIVE DIAGNOSES:  1. Chronic pelvic pain with acute exacerbation.  2. Left ovarian cyst.  3. Possible right ovarian torsion.   POSTOPERATIVE DIAGNOSES:  1. Dense pelvic adhesive disease.  2. Left adnexal mass.  3. Torsion ruled out.  4. Peritoneal inclusion cyst.   OPERATIVE PROCEDURE:  1. Laparoscopic adhesiolysis (90 minutes).  2. Exploratory laparotomy with left salpingo-oophorectomy.   SURGEON: Alanda Slim. Ladean Steinmeyer, MD  FIRST ASSISTANT: Herbert Moors, NP; Leticia Clas, PA-S.   INTRAOPERATIVE CONSULTATION: General surgery, Marlyce Huge, MD.   ANESTHESIA: General endotracheal.   INDICATIONS: The patient is a 49 year old white female who presents with acute abdominal pain and findings on ultrasound that were suspicious for possible right ovarian torsion. The patient had multiple surgical procedures, including cesarean section x3, total abdominal hysterectomy, laparoscopic cholecystectomy, tubal ligation, hernia repair x2 with mesh placement.   FINDINGS AT SURGERY: Extensive peritoneal, omental and small bowel adhesions which were lysed. The lysis of adhesions took 90 minutes of operating time. There was evidence of a peritoneal inclusion cyst with clear fluid being present. The right ovary was atrophic, approximately 1.5 cm in diameter, and stuck to the right pelvic sidewall. This was not removed. The left ovary and tube were encased in dense adhesions. Left salpingo-oophorectomy was performed following the exploratory laparotomy because it could not be accomplished laparoscopically due to the extensive adhesions. Mild to moderate amount of bleeding was encountered during the laparotomy portion of the surgery from which the source could not be identified, and Dr. Rexene Washington of general surgery was called in to assist to assess bowel, omentum, for bleeding as well to  assist with the LSO.   DESCRIPTION OF THE PROCEDURE: The patient was brought to the operating room, where she was placed in the supine position. General endotracheal anesthesia was induced without difficulty. She was placed in the low lithotomy position using the bumblebee stirrups. A ChloraPrep and Betadine abdominal, perineal and intravaginal prep and drape was performed in standard fashion. A Foley catheter was placed and was draining clear yellow urine from the bladder. A sponge stick was placed into the vagina to help facilitate orientation intraoperatively. Because of the patient's extensive past surgical history, a decision was made to enter the abdomen through a left upper quadrant port in the midaxillary line approximately 4 cm below the costal margin. Towel clips were placed into the skin, and a 5 mm incision was made transversely, and the Optiview laparoscopic trocar was placed under direct visualization, without evidence of bowel or vascular injury. The above-noted extensive adhesions in the pelvis were identified, and decision was made to perform a laparoscopic adhesiolysis. Three 5 mm ports were placed in a semilunar arc ranging from the left lower quadrant to above the umbilicus to the right lower quadrant. These ports were utilized along with graspers to aid in the dissection. The Ace Harmonic scalpel was used for the dissection. Over the next 90 minutes, an adhesiolysis was performed, bringing down the extensive anterior abdominal wall adhesions. There was evidence of staples along the graft line anteriorly that was noted. The peritoneum was peeled off of the abdominal wall through the aid of sharp and blunt dissection. Once adequately mobilized, the peritoneal inclusion cyst previously identified was entered, and clear fluid was noted. This decompressed the 10 mm cyst that was previously seen on ultrasound. Inspection of the right adnexal region did demonstrate the atrophic 1.5  cm ovary that was  stuck to the right pelvic sidewall. The left tube and ovary were encased in dense adhesions. Because of the extent of the adhesions and the concern about ureter, decision was made to perform minilaparotomy in order to accomplish the dissection and LSO. The laparoscopic portion of the procedure was terminated. The midline incision was then made from the umbilicus region to approximately 4 cm above the symphysis pubis, just to where the extensive mesh staple line was identified. The peritoneum was entered. The Balfour retractor was used to facilitate exposure. Further dissection and adhesiolysis were completed. During this time, a mild to moderate amount of bleeding was encountered, and therefore intraoperative consultation with general surgery was requested. The left adnexa was then dissected out using sharp and blunt technique with the aid of Dr. Rexene Washington. The infundibulopelvic ligament was isolated, clamped and cut and tied off using 0 Vicryl suture. A stick tie was placed along with a free tie to optimize hemostasis. The remainder of the adhesions to the vaginal cuff were taken down, and the ovarian/adnexal mass was then excised from the operative field. During the excision, the multicystic ovary did open and release serous fluid. The adnexa was sent to pathology. Further inspection of the pelvis, bowel and omentum revealed no significant bleeding sites. The pelvis was irrigated and aspirated. Decision was then made to close the abdomen in standard fashion using 0 Maxon. The skin was closed with staples. Pressure dressing was applied. The patient was then awakened, extubated and taken to the recovery room in satisfactory condition. The laparoscopic ports were closed with 4-0 Vicryl suture and Dermabond glue. All instruments, needles and sponge counts were verified as correct. Estimated blood loss was 350 mL. Urine output was not quantified precisely, but it was approximately 150 in the bag at the end of the case.  Fluids administered were 2700 mL crystalloid.   ____________________________ Alanda Slim. Meliza Kage, MD mad:lb D: 02/07/2013 11:55:00 ET T: 02/07/2013 13:14:05 ET JOB#: [id number]  cc: Beverly Done A. Korry Dalgleish, MD, <Dictator> [facility] Beverly Washington, [place] MD ELECTRONICALLY SIGNED 02/07/2013 20:07

## 2014-07-23 NOTE — Op Note (Signed)
PATIENT NAME:  Beverly Washington, Beverly Washington MR#:  [id number] DATE OF BIRTH:  04-19-1965  DATE OF PROCEDURE:  02/20/2013  PREOPERATIVE DIAGNOSES: 1.  Postoperative wound infection.  2.  Recent abdominal exploration for lysis of adhesions.  3.  Poor venous access.  POSTOPERATIVE DIAGNOSES: 1.  Postoperative wound infection.  2.  Recent abdominal exploration for lysis of adhesions.  3.  Poor venous access.  PROCEDURES:  1. Ultrasound guidance for vascular access to left basilic vein.  2. Fluoroscopic guidance for placement of catheter.  3. Insertion of peripherally inserted central venous catheter, triple lumen, left arm.  SURGEON: Leotis Pain, MD  ANESTHESIA: Local.   ESTIMATED BLOOD LOSS: Minimal.   INDICATION FOR PROCEDURE: A 49 year old female with a wound infection and previous mesh placement. She will require intravenous antibiotics extended, and we are placing a PICC line for durable venous access.   DESCRIPTION OF PROCEDURE: The patient's left arm was sterilely prepped and draped, and a sterile surgical field was created. The left basilic vein was accessed under direct ultrasound guidance without difficulty with a micropuncture needle and permanent image was recorded. 0.018 wire was then placed into the superior vena cava. Peel-away sheath was placed over the wire. A single lumen peripherally inserted central venous catheter was then placed over the wire and the wire and peel-away sheath were removed. The catheter tip was placed into the superior vena cava and was secured at the skin at 41 cm with a sterile dressing. The catheter withdrew blood well and flushed easily with heparinized saline. The patient tolerated procedure well.   ____________________________ Algernon Huxley, MD jsd:ce D: 02/21/2013 12:03:27 ET T: 02/21/2013 12:52:11 ET JOB#: [id number]  cc: Algernon Huxley, MD, <Dictator> Algernon Huxley MD ELECTRONICALLY SIGNED 03/09/2013 9:21

## 2015-02-04 ENCOUNTER — Telehealth: Payer: Self-pay | Admitting: Cardiovascular Disease

## 2015-02-04 NOTE — Telephone Encounter (Signed)
3rd attempt  to schedule from recall list. LMOV to call office for scheduling. ° ° °Deleting recall.   °

## 2015-05-19 ENCOUNTER — Ambulatory Visit
Admission: RE | Admit: 2015-05-19 | Discharge: 2015-05-19 | Disposition: A | Discharge status: Home / Self Care | Payer: 59 | Source: Ambulatory Visit | Attending: Internal Medicine | Admitting: Internal Medicine

## 2015-05-19 ENCOUNTER — Other Ambulatory Visit: Payer: Self-pay | Admitting: Internal Medicine

## 2015-05-19 ENCOUNTER — Ambulatory Visit
Admission: RE | Admit: 2015-05-19 | Discharge: 2015-05-19 | Disposition: A | Discharge status: Home / Self Care | Payer: 59 | Source: Ambulatory Visit | Attending: Cardiology | Admitting: Cardiology

## 2015-05-19 DIAGNOSIS — M19071 Primary osteoarthritis, right ankle and foot: Secondary | ICD-10-CM | POA: Insufficient documentation

## 2015-05-19 DIAGNOSIS — M79671 Pain in right foot: Secondary | ICD-10-CM

## 2015-07-28 ENCOUNTER — Ambulatory Visit (INDEPENDENT_AMBULATORY_CARE_PROVIDER_SITE_OTHER): Payer: 59 | Admitting: Cardiovascular Disease

## 2015-07-28 ENCOUNTER — Telehealth: Payer: Self-pay | Admitting: Cardiovascular Disease

## 2015-07-28 ENCOUNTER — Encounter: Payer: Self-pay | Admitting: Cardiovascular Disease

## 2015-07-28 VITALS — BP 134/82 | HR 92 | Ht 64.0 in | Wt 216.0 lb

## 2015-07-28 DIAGNOSIS — Z7689 Persons encountering health services in other specified circumstances: Secondary | ICD-10-CM | POA: Diagnosis not present

## 2015-07-28 DIAGNOSIS — Z01812 Encounter for preprocedural laboratory examination: Secondary | ICD-10-CM | POA: Diagnosis not present

## 2015-07-28 DIAGNOSIS — I200 Unstable angina: Secondary | ICD-10-CM

## 2015-07-28 MED ORDER — CLOPIDOGREL BISULFATE 75 MG PO TABS: 75.0000 mg | ORAL_TABLET | Freq: Every day | ORAL | Status: DC

## 2015-07-28 NOTE — Patient Instructions (Addendum)
Medication Instructions:  Your physician has recommended you make the following change in your medication:  START taking Plavix 75mg  once daily    Labwork: BMET, CBC, PT/INR  Testing/Procedures: A chest x-ray takes a picture of the organs and structures inside the chest, including the heart, lungs, and blood vessels. This test can show several things, including, whether the heart is enlarges; whether fluid is building up in the lungs; and whether pacemaker / defibrillator leads are still in place.  Your physician has requested that you have a cardiac catheterization. Cardiac catheterization is used to diagnose and/or treat various heart conditions. Doctors may recommend this procedure for a number of different reasons. The most common reason is to evaluate chest pain. Chest pain can be a symptom of coronary artery disease (CAD), and cardiac catheterization can show whether plaque is narrowing or blocking your heart's arteries. This procedure is also used to evaluate the valves, as well as measure the blood flow and oxygen levels in different parts of your heart. For further information please visit [email or web address]. Please follow instruction sheet, as given.  [facility] Cardiac Cath Instructions   You are scheduled for a Cardiac Cath on: Friday, May 5  Please arrive at _______am on the day of your procedure  You will need to pre-register prior to the day of your procedure.  Enter through the [employer] at [facility].  Registration is the first desk on your right.  Please take the procedure order we have given you in order to be registered appropriately  Do not eat/drink anything after midnight  Someone will need to drive you home  It is recommended someone be with you for the first 24 hours after your procedure  Wear clothes that are easy to get on/off and wear slip on shoes if possible   Medications bring a current list of all medications with you  __xx_ You may take all of your  medications the morning of your procedure with enough water to swallow safely     Day of your procedure: Arrive at the [place] entrance.  Free valet service is available.  After entering the [facility] please check-in at the registration desk (1st desk on your right) to receive your armband. After receiving your armband someone will escort you to the cardiac cath/special procedures waiting area.  The usual length of stay after your procedure is about 2 to 3 hours.  This can vary.  If you have any questions, please call our office at [phone], or you may call the cardiac cath lab at [facility] directly at [phone]   Follow-Up: Your physician recommends that you schedule a follow-up appointment in: 3 weeks with Dr. Fletcher Anon.    Any Other Special Instructions Will Be Listed Below (If Applicable).     If you need a refill on your cardiac medications before your next appointment, please call your pharmacy.  Angiogram An angiogram, also called angiography, is a procedure used to look at the blood vessels. In this procedure, dye is injected through a long, thin tube (catheter) into an artery. X-rays are then taken. The X-rays will show if there is a blockage or problem in a blood vessel.  LET [facility] CARE PROVIDER KNOW ABOUT:  Any allergies you have, including allergies to shellfish or contrast dye.   All medicines you are taking, including vitamins, herbs, eye drops, creams, and over-the-counter medicines.   Previous problems you or members of your family have had with the use of anesthetics.   Any  blood disorders you have.   Previous surgeries you have had.  Any previous kidney problems or failure you have had.  Medical conditions you have.   Possibility of pregnancy, if this applies. RISKS AND COMPLICATIONS Generally, an angiogram is a safe procedure. However, as with any procedure, problems can occur. Possible problems include:  Injury to the blood vessels,  including rupture or bleeding.  Infection or bruising at the catheter site.  Allergic reaction to the dye or contrast used.  Kidney damage from the dye or contrast used.  Blood clots that can lead to a stroke or heart attack. BEFORE THE PROCEDURE  Do not eat or drink after midnight on the night before the procedure, or as directed by your health care provider.   Ask your health care provider if you may drink enough water to take any needed medicines the morning of the procedure.  PROCEDURE  You may be given a medicine to help you relax (sedative) before and during the procedure. This medicine is given through an IV access tube that is inserted into one of your veins.   The area where the catheter will be inserted will be washed and shaved. This is usually done in the groin but may be done in the fold of your arm (near your elbow) or in the wrist.  A medicine will be given to numb the area where the catheter will be inserted (local anesthetic).  The catheter will be inserted with a guide wire into an artery. The catheter is guided by using a type of X-ray (fluoroscopy) to the blood vessel being examined.   Dye is then injected into the catheter, and X-rays are taken. The dye helps to show where any narrowing or blockages are located.  AFTER THE PROCEDURE   If the procedure is done through the leg, you will be kept in bed lying flat for several hours. You will be instructed to not bend or cross your legs.  The insertion site will be checked frequently.  The pulse in your feet or wrist will be checked frequently.  Additional blood tests, X-rays, and electrocardiography may be done.   You may need to stay in the hospital overnight for observation.    This information is not intended to replace advice given to you by your health care provider. Make sure you discuss any questions you have with your health care provider.   Document Released: 12/27/2004 Document Revised:  04/09/2014 Document Reviewed: 08/20/2012 Elsevier Interactive Patient Education 2016 [place] After Refer to this sheet in the next few weeks. These instructions provide you with information about caring for yourself after your procedure. Your health care provider may also give you more specific instructions. Your treatment has been planned according to current medical practices, but problems sometimes occur. Call your health care provider if you have any problems or questions after your procedure. WHAT TO EXPECT AFTER THE PROCEDURE After your procedure, it is typical to have the following:  Bruising at the catheter insertion site that usually fades within 1-2 weeks.  Blood collecting in the tissue (hematoma) that may be painful to the touch. It should usually decrease in size and tenderness within 1-2 weeks. HOME CARE INSTRUCTIONS  Take medicines only as directed by your health care provider.  You may shower 24-48 hours after the procedure or as directed by your health care provider. Remove the bandage (dressing) and gently wash the site with plain soap and water. Pat the area dry with a  clean towel. Do not rub the site, because this may cause bleeding.  Do not take baths, swim, or use a hot tub until your health care provider approves.  Check your insertion site every day for redness, swelling, or drainage.  Do not apply powder or lotion to the site.  Do not lift over 10 lb (4.5 kg) for 5 days after your procedure or as directed by your health care provider.  Ask your health care provider when it is okay to:  Return to work or school.  Resume usual physical activities or sports.  Resume sexual activity.  Do not drive home if you are discharged the same day as the procedure. Have someone else drive you.  You may drive 24 hours after the procedure unless otherwise instructed by your health care provider.  Do not operate machinery or power tools for 24 hours  after the procedure or as directed by your health care provider.  If your procedure was done as an outpatient procedure, which means that you went home the same day as your procedure, a responsible adult should be with you for the first 24 hours after you arrive home.  Keep all follow-up visits as directed by your health care provider. This is important. SEEK MEDICAL CARE IF:  You have a fever.  You have chills.  You have increased bleeding from the catheter insertion site. Hold pressure on the site. SEEK IMMEDIATE MEDICAL CARE IF:  You have unusual pain at the catheter insertion site.  You have redness, warmth, or swelling at the catheter insertion site.  You have drainage (other than a small amount of blood on the dressing) from the catheter insertion site.  The catheter insertion site is bleeding, and the bleeding does not stop after 30 minutes of holding steady pressure on the site.  The area near or just beyond the catheter insertion site becomes pale, cool, tingly, or numb.   This information is not intended to replace advice given to you by your health care provider. Make sure you discuss any questions you have with your health care provider.   Document Released: 10/05/2004 Document Revised: 04/09/2014 Document Reviewed: 08/20/2012 Elsevier Interactive Patient Education [employer].

## 2015-07-28 NOTE — Telephone Encounter (Signed)
S/w scheduling.  Left heart cath Friday, May 5, 7:30am S/w pt who verbalized understanding of 6:30am arrival time. Pt had no further questions.

## 2015-07-28 NOTE — Progress Notes (Signed)
Cardiology Office Note   Date:  07/28/2015   ID:  Beverly Washington, DOB 06-May-1965, MRN [id number]  PCP:  [name], MD  Cardiologist:   Kathlyn Sacramento, MD   No chief complaint on file.     History of Present Illness: Beverly Washington is a 50 y.o. female who presents for a follow-up visit regarding coronary artery disease. She has known history of hypertension, hyperlipidemia, borderline diabetes and strong family history of premature coronary artery disease. She was seen last year for exertional chest pain. She underwent a stress echocardiogram. However, this was limited by hypertensive response to exercise with a blood pressure of 200/100. She also had significant limiting chest tightness. Peak heart rate was 106 only. Echo images showed normal LV systolic function with no evidence of ischemia at low level of exercise. Cardiac catheterization showed an 80% discrete mid LAD stenosis at the origin of first diagonal branch. The LAD was very small after the diagonal branch less than 2.5 mm. Ejection fraction was 65% with mildly elevated left ventricular end-diastolic pressure. I recommended medical therapy. I added amlodipine with subsequent improvement in blood pressure. She did not tolerate treatment with a beta blocker due to fatigue. Nitroglycerin gave her a headache. She presents now with recent worsening of exertional chest pain over the last few weeks. This is now happening with minimal activities. It's described as substernal chest tightness radiating to her left arm. It usually resolves with rest. She has been under increased stress lately. She had EKGs done by Dr. Lavera Guise which were unremarkable. The chest pain is associated with significant exertional dyspnea.   Past Medical History  Diagnosis Date  . Hypertension   . Hyperlipidemia   . Coronary artery disease     Cardiac catheterization in February 2016 showed an 80% discrete mid LAD stenosis at the origin of first diagonal  branch. The LAD was very small after the diagonal branch less than 2.5 mm. Ejection fraction was 65% with mildly elevated left ventricular end-diastolic pressure.    Past Surgical History  Procedure Laterality Date  . Cesarean section      x3  . Abdominal plasty    . Breast reduction surgery    . Total abdominal hysterectomy    . Hernia repair    . Oophorectomy    . Eye muscle surgery    . Cholecystectomy    . Cardiac catheterization  05/07/2014    [facility]     Current Outpatient Prescriptions  Medication Sig Dispense Refill  . amLODipine (NORVASC) 2.5 MG tablet Take 1 tablet (2.5 mg total) by mouth daily. 30 tablet 6  . aspirin EC 81 MG tablet Take 81 mg by mouth daily.     Marland Kitchen CARTIA XT 120 MG 24 hr capsule Take 120 mg by mouth daily.   6  . Multiple Vitamin (MULTI-VITAMINS) TABS Take 1 tablet by mouth daily.     Marland Kitchen omeprazole (PRILOSEC) 20 MG capsule Take 1 capsule by mouth daily.     . rosuvastatin (CRESTOR) 10 MG tablet Take 10 mg by mouth daily.    Marland Kitchen zolpidem (AMBIEN) 5 MG tablet Take 5 mg by mouth as needed.    . clopidogrel (PLAVIX) 75 MG tablet Take 1 tablet (75 mg total) by mouth daily. 30 tablet 3   No current facility-administered medications for this visit.    Allergies:   Ceftriaxone; Hydromorphone; Latex; and Olmesartan    Social History:  The patient  reports that she has never  smoked. She does not have any smokeless tobacco history on file. She reports that she does not drink alcohol or use illicit drugs.   Family History:  The patient's family history includes Heart attack in her maternal uncle, maternal uncle, maternal uncle, maternal uncle, maternal uncle, maternal uncle, mother, and paternal uncle; Heart disease in her maternal uncle, maternal uncle, maternal uncle, maternal uncle, maternal uncle, maternal uncle, and mother; Pulmonary embolism in her mother.    ROS:  Please see the history of present illness.   Otherwise, review of systems are positive for none.    All other systems are reviewed and negative.    PHYSICAL EXAM: VS:  BP 134/82 mmHg  Pulse 92  Ht 5\' 4"  (1.626 m)  Wt 216 lb (97.977 kg)  BMI 37.06 kg/m2 , BMI Body mass index is 37.06 kg/(m^2). GEN: Well nourished, well developed, in no acute distress HEENT: normal Neck: no JVD, carotid bruits, or masses Cardiac: RRR; no murmurs, rubs, or gallops,no edema  Respiratory:  clear to auscultation bilaterally, normal work of breathing GI: soft, nontender, nondistended, + BS MS: no deformity or atrophy Skin: warm and dry, no rash Neuro:  Strength and sensation are intact Psych: euthymic mood, full affect   EKG:  EKG is not ordered today.    Recent Labs: No results found for requested labs within last 365 days.    Lipid Panel No results found for: CHOL, TRIG, HDL, CHOLHDL, VLDL, LDLCALC, LDLDIRECT    Wt Readings from Last 3 Encounters:  07/28/15 216 lb (97.977 kg)  06/03/14 213 lb 4 oz (96.73 kg)  05/06/14 211 lb (95.709 kg)         ASSESSMENT AND PLAN:  1.  Coronary artery disease involving native coronary arteries with unstable angina: The patient is having worsening anginal symptoms now happening with minimal activities. She has known history of coronary artery disease on cardiac catheterization from last year which showed one-vessel disease involving the mid LAD. She is already on a calcium channel blocker and did not tolerate treatment with a beta blocker due to fatigue or long-acting nitrate due to headache. Given her symptoms, I recommend proceeding with urgent cardiac catheterization and possible coronary intervention. I started her on Plavix 75 mg once daily.  2. Essential hypertension: Blood pressure is reasonably controlled. She is currently on both amlodipine and diltiazem and I might consider discontinuing amlodipine and increasing diltiazem in the near future.  3. Hyperlipidemia: Continue treatment with rosuvastatin with a target LDL of less than 70.      Disposition:   FU with me in 1 month  Signed,  Kathlyn Sacramento, MD  07/28/2015 4:38 PM    [place]

## 2015-08-01 ENCOUNTER — Telehealth: Payer: Self-pay | Admitting: Cardiovascular Disease

## 2015-08-01 NOTE — Telephone Encounter (Signed)
Spoke to patient about making 3W fu appointment from 07/28/15 She will call back to make this appointment  She was driving.

## 2015-08-02 ENCOUNTER — Ambulatory Visit
Admission: RE | Admit: 2015-08-02 | Discharge: 2015-08-02 | Disposition: A | Discharge status: Home / Self Care | Payer: 59 | Source: Ambulatory Visit | Attending: Cardiovascular Disease | Admitting: Cardiovascular Disease

## 2015-08-02 DIAGNOSIS — Z01810 Encounter for preprocedural cardiovascular examination: Secondary | ICD-10-CM | POA: Insufficient documentation

## 2015-08-02 DIAGNOSIS — R079 Chest pain, unspecified: Secondary | ICD-10-CM | POA: Diagnosis not present

## 2015-08-02 DIAGNOSIS — Z01812 Encounter for preprocedural laboratory examination: Secondary | ICD-10-CM

## 2015-08-04 ENCOUNTER — Telehealth: Payer: Self-pay | Admitting: Cardiovascular Disease

## 2015-08-04 NOTE — Telephone Encounter (Signed)
S/w Erin Sons in scheduling who confirmed she has scheduled left heart cath w/possible PCI

## 2015-08-05 ENCOUNTER — Ambulatory Visit
Admission: RE | Admit: 2015-08-05 | Discharge: 2015-08-05 | Disposition: A | Discharge status: Home / Self Care | Payer: 59 | Source: Ambulatory Visit | Attending: Cardiovascular Disease | Admitting: Cardiovascular Disease

## 2015-08-05 ENCOUNTER — Encounter
Admission: RE | Disposition: A | Discharge status: Home / Self Care | Payer: Self-pay | Source: Ambulatory Visit | Attending: Cardiovascular Disease

## 2015-08-05 ENCOUNTER — Encounter: Payer: Self-pay | Admitting: Cardiovascular Disease

## 2015-08-05 ENCOUNTER — Other Ambulatory Visit: Payer: Self-pay | Admitting: Cardiovascular Disease

## 2015-08-05 DIAGNOSIS — Z91040 Latex allergy status: Secondary | ICD-10-CM | POA: Insufficient documentation

## 2015-08-05 DIAGNOSIS — Z79899 Other long term (current) drug therapy: Secondary | ICD-10-CM | POA: Diagnosis not present

## 2015-08-05 DIAGNOSIS — Z9049 Acquired absence of other specified parts of digestive tract: Secondary | ICD-10-CM | POA: Insufficient documentation

## 2015-08-05 DIAGNOSIS — Z888 Allergy status to other drugs, medicaments and biological substances status: Secondary | ICD-10-CM | POA: Diagnosis not present

## 2015-08-05 DIAGNOSIS — Z7982 Long term (current) use of aspirin: Secondary | ICD-10-CM | POA: Insufficient documentation

## 2015-08-05 DIAGNOSIS — I200 Unstable angina: Secondary | ICD-10-CM | POA: Insufficient documentation

## 2015-08-05 DIAGNOSIS — E785 Hyperlipidemia, unspecified: Secondary | ICD-10-CM | POA: Insufficient documentation

## 2015-08-05 DIAGNOSIS — Z8249 Family history of ischemic heart disease and other diseases of the circulatory system: Secondary | ICD-10-CM | POA: Insufficient documentation

## 2015-08-05 DIAGNOSIS — I25110 Atherosclerotic heart disease of native coronary artery with unstable angina pectoris: Secondary | ICD-10-CM | POA: Diagnosis not present

## 2015-08-05 DIAGNOSIS — I10 Essential (primary) hypertension: Secondary | ICD-10-CM | POA: Insufficient documentation

## 2015-08-05 DIAGNOSIS — R7303 Prediabetes: Secondary | ICD-10-CM | POA: Insufficient documentation

## 2015-08-05 DIAGNOSIS — I209 Angina pectoris, unspecified: Secondary | ICD-10-CM | POA: Diagnosis present

## 2015-08-05 DIAGNOSIS — Z90710 Acquired absence of both cervix and uterus: Secondary | ICD-10-CM | POA: Insufficient documentation

## 2015-08-05 HISTORY — PX: CARDIAC CATHETERIZATION: SHX172

## 2015-08-05 SURGERY — LEFT HEART CATH AND CORONARY ANGIOGRAPHY
Anesthesia: Moderate Sedation | Laterality: Left

## 2015-08-05 MED ORDER — SODIUM CHLORIDE 0.9% FLUSH: 3.0000 mL | INTRAVENOUS | Status: DC | PRN

## 2015-08-05 MED ORDER — HEPARIN SODIUM (PORCINE) 1000 UNIT/ML IJ SOLN
INTRAMUSCULAR | Status: AC
  Filled 2015-08-05: qty 1

## 2015-08-05 MED ORDER — MIDAZOLAM HCL 2 MG/2ML IJ SOLN
INTRAMUSCULAR | Status: AC
  Filled 2015-08-05: qty 2

## 2015-08-05 MED ORDER — SODIUM CHLORIDE 0.9 % IV SOLN
250.0000 mL | INTRAVENOUS | Status: DC | PRN
Start: 2015-08-05 — End: 2015-08-05

## 2015-08-05 MED ORDER — SODIUM CHLORIDE 0.9% FLUSH: 3.0000 mL | Freq: Two times a day (BID) | INTRAVENOUS | Status: DC

## 2015-08-05 MED ORDER — VERAPAMIL HCL 2.5 MG/ML IV SOLN
INTRAVENOUS | Status: AC
  Filled 2015-08-05: qty 2

## 2015-08-05 MED ORDER — SODIUM CHLORIDE 0.9 % IV SOLN: INTRAVENOUS | Status: DC

## 2015-08-05 MED ORDER — SODIUM CHLORIDE 0.9 % IV SOLN: 250.0000 mL | INTRAVENOUS | Status: DC | PRN

## 2015-08-05 MED ORDER — FENTANYL CITRATE (PF) 100 MCG/2ML IJ SOLN
INTRAMUSCULAR | Status: DC | PRN
  Administered 2015-08-05 (×4): 25 ug via INTRAVENOUS

## 2015-08-05 MED ORDER — NITROGLYCERIN 5 MG/ML IV SOLN
INTRAVENOUS | Status: AC
Start: 2015-08-05 — End: 2015-08-05
  Filled 2015-08-05: qty 10

## 2015-08-05 MED ORDER — HEPARIN (PORCINE) IN NACL 2-0.9 UNIT/ML-% IJ SOLN
INTRAMUSCULAR | Status: AC
  Filled 2015-08-05: qty 500

## 2015-08-05 MED ORDER — FENTANYL CITRATE (PF) 100 MCG/2ML IJ SOLN
INTRAMUSCULAR | Status: AC
  Filled 2015-08-05: qty 2

## 2015-08-05 MED ORDER — ISOSORBIDE MONONITRATE ER 30 MG PO TB24: 30.0000 mg | ORAL_TABLET | Freq: Every day | ORAL | Status: DC

## 2015-08-05 MED ORDER — VERAPAMIL HCL 2.5 MG/ML IV SOLN
INTRAVENOUS | Status: DC | PRN
  Administered 2015-08-05: 2.5 mg via INTRA_ARTERIAL

## 2015-08-05 MED ORDER — SODIUM CHLORIDE 0.9 % IV SOLN
INTRAVENOUS | Status: DC
  Administered 2015-08-05: 07:00:00 via INTRAVENOUS

## 2015-08-05 MED ORDER — ASPIRIN 81 MG PO CHEW: 81.0000 mg | CHEWABLE_TABLET | ORAL | Status: DC

## 2015-08-05 MED ORDER — HEPARIN SODIUM (PORCINE) 1000 UNIT/ML IJ SOLN
INTRAMUSCULAR | Status: DC | PRN
  Administered 2015-08-05: 5000 [IU] via INTRAVENOUS

## 2015-08-05 MED ORDER — IOPAMIDOL (ISOVUE-300) INJECTION 61%
INTRAVENOUS | Status: DC | PRN
  Administered 2015-08-05: 85 mL via INTRA_ARTERIAL

## 2015-08-05 MED ORDER — MIDAZOLAM HCL 2 MG/2ML IJ SOLN
INTRAMUSCULAR | Status: DC | PRN
  Administered 2015-08-05: 1 mg via INTRAVENOUS
  Administered 2015-08-05 (×2): 0.5 mg via INTRAVENOUS

## 2015-08-05 MED ORDER — NITROGLYCERIN 1 MG/10 ML FOR IR/CATH LAB
INTRA_ARTERIAL | Status: DC | PRN
  Administered 2015-08-05: 300 ug via INTRA_ARTERIAL

## 2015-08-05 SURGICAL SUPPLY — 7 items
CATH INFINITI 5FR ANG PIGTAIL (CATHETERS) ×2 IMPLANT
CATH OPTITORQUE JACKY 4.0 5F (CATHETERS) ×2 IMPLANT
DEVICE RAD TR BAND REGULAR (VASCULAR PRODUCTS) ×2 IMPLANT
GLIDESHEATH SLEND SS 6F .021 (SHEATH) ×2 IMPLANT
KIT MANI 3VAL PERCEP (MISCELLANEOUS) ×2 IMPLANT
PACK CARDIAC CATH (CUSTOM PROCEDURE TRAY) ×2 IMPLANT
WIRE SAFE-T 1.5MM-J .035X260CM (WIRE) ×2 IMPLANT

## 2015-08-05 NOTE — Interval H&P Note (Signed)
Cath Lab Visit (complete for each Cath Lab visit)  Clinical Evaluation Leading to the Procedure:   ACS: No.  Non-ACS:    Anginal Classification: CCS III  Anti-ischemic medical therapy: Minimal Therapy (1 class of medications)  Non-Invasive Test Results: No non-invasive testing performed  Prior CABG: No previous CABG      History and Physical Interval Note:  08/05/2015 7:52 AM  Beverly Washington  has presented today for surgery, with the diagnosis of Angina   CAD    W POSSIBLE PCI per Ivin Booty Dr Fletcher Anon office  The various methods of treatment have been discussed with the patient and family. After consideration of risks, benefits and other options for treatment, the patient has consented to  Procedure(s): Left Heart Cath and Coronary Angiography (Left) as a surgical intervention .  The patient's history has been reviewed, patient examined, no change in status, stable for surgery.  I have reviewed the patient's chart and labs.  Questions were answered to the patient's satisfaction.     Kathlyn Sacramento

## 2015-08-05 NOTE — Discharge Instructions (Signed)
Angiogram, Care After Refer to this sheet in the next few weeks. These instructions provide you with information about caring for yourself after your procedure. Your health care provider may also give you more specific instructions. Your treatment has been planned according to current medical practices, but problems sometimes occur. Call your health care provider if you have any problems or questions after your procedure. WHAT TO EXPECT AFTER THE PROCEDURE After your procedure, it is typical to have the following:  Bruising at the catheter insertion site that usually fades within 1-2 weeks.  Blood collecting in the tissue (hematoma) that may be painful to the touch. It should usually decrease in size and tenderness within 1-2 weeks. HOME CARE INSTRUCTIONS  Take medicines only as directed by your health care provider.  You may shower 24-48 hours after the procedure or as directed by your health care provider. Remove the bandage (dressing) and gently wash the site with plain soap and water. Pat the area dry with a clean towel. Do not rub the site, because this may cause bleeding.  Do not take baths, swim, or use a hot tub until your health care provider approves.  Check your insertion site every day for redness, swelling, or drainage.  Do not apply powder or lotion to the site.  Do not lift over 10 lb (4.5 kg) for 5 days after your procedure or as directed by your health care provider.  Ask your health care provider when it is okay to:  Return to work or school.  Resume usual physical activities or sports.  Resume sexual activity.  Do not drive home if you are discharged the same day as the procedure. Have someone else drive you.  You may drive 24 hours after the procedure unless otherwise instructed by your health care provider.  Do not operate machinery or power tools for 24 hours after the procedure or as directed by your health care provider.  If your procedure was done as an  outpatient procedure, which means that you went home the same day as your procedure, a responsible adult should be with you for the first 24 hours after you arrive home.  Keep all follow-up visits as directed by your health care provider. This is important. SEEK MEDICAL CARE IF:  You have a fever.  You have chills.  You have increased bleeding from the catheter insertion site. Hold pressure on the site. SEEK IMMEDIATE MEDICAL CARE IF:  You have unusual pain at the catheter insertion site.  You have redness, warmth, or swelling at the catheter insertion site.  You have drainage (other than a small amount of blood on the dressing) from the catheter insertion site.  The catheter insertion site is bleeding, and the bleeding does not stop after 30 minutes of holding steady pressure on the site.  The area near or just beyond the catheter insertion site becomes pale, cool, tingly, or numb.   This information is not intended to replace advice given to you by your health care provider. Make sure you discuss any questions you have with your health care provider.   Document Released: 10/05/2004 Document Revised: 04/09/2014 Document Reviewed: 08/20/2012 Elsevier Interactive Patient Education 2016 [place] taking Imdur 30 mg once daily.  A prescription was sent to your pharmacy.

## 2015-08-05 NOTE — H&P (View-Only) (Signed)
Cardiology Office Note   Date:  07/28/2015   ID:  YELONDA ASSEFA, DOB 06-May-1965, MRN [id number]  PCP:  [name], MD  Cardiologist:   Kathlyn Sacramento, MD   No chief complaint on file.     History of Present Illness: Beverly Washington is a 50 y.o. female who presents for a follow-up visit regarding coronary artery disease. She has known history of hypertension, hyperlipidemia, borderline diabetes and strong family history of premature coronary artery disease. She was seen last year for exertional chest pain. She underwent a stress echocardiogram. However, this was limited by hypertensive response to exercise with a blood pressure of 200/100. She also had significant limiting chest tightness. Peak heart rate was 106 only. Echo images showed normal LV systolic function with no evidence of ischemia at low level of exercise. Cardiac catheterization showed an 80% discrete mid LAD stenosis at the origin of first diagonal branch. The LAD was very small after the diagonal branch less than 2.5 mm. Ejection fraction was 65% with mildly elevated left ventricular end-diastolic pressure. I recommended medical therapy. I added amlodipine with subsequent improvement in blood pressure. She did not tolerate treatment with a beta blocker due to fatigue. Nitroglycerin gave her a headache. She presents now with recent worsening of exertional chest pain over the last few weeks. This is now happening with minimal activities. It's described as substernal chest tightness radiating to her left arm. It usually resolves with rest. She has been under increased stress lately. She had EKGs done by Dr. Lavera Guise which were unremarkable. The chest pain is associated with significant exertional dyspnea.   Past Medical History  Diagnosis Date  . Hypertension   . Hyperlipidemia   . Coronary artery disease     Cardiac catheterization in February 2016 showed an 80% discrete mid LAD stenosis at the origin of first diagonal  branch. The LAD was very small after the diagonal branch less than 2.5 mm. Ejection fraction was 65% with mildly elevated left ventricular end-diastolic pressure.    Past Surgical History  Procedure Laterality Date  . Cesarean section      x3  . Abdominal plasty    . Breast reduction surgery    . Total abdominal hysterectomy    . Hernia repair    . Oophorectomy    . Eye muscle surgery    . Cholecystectomy    . Cardiac catheterization  05/07/2014    [facility]     Current Outpatient Prescriptions  Medication Sig Dispense Refill  . amLODipine (NORVASC) 2.5 MG tablet Take 1 tablet (2.5 mg total) by mouth daily. 30 tablet 6  . aspirin EC 81 MG tablet Take 81 mg by mouth daily.     Marland Kitchen CARTIA XT 120 MG 24 hr capsule Take 120 mg by mouth daily.   6  . Multiple Vitamin (MULTI-VITAMINS) TABS Take 1 tablet by mouth daily.     Marland Kitchen omeprazole (PRILOSEC) 20 MG capsule Take 1 capsule by mouth daily.     . rosuvastatin (CRESTOR) 10 MG tablet Take 10 mg by mouth daily.    Marland Kitchen zolpidem (AMBIEN) 5 MG tablet Take 5 mg by mouth as needed.    . clopidogrel (PLAVIX) 75 MG tablet Take 1 tablet (75 mg total) by mouth daily. 30 tablet 3   No current facility-administered medications for this visit.    Allergies:   Ceftriaxone; Hydromorphone; Latex; and Olmesartan    Social History:  The patient  reports that she has never  smoked. She does not have any smokeless tobacco history on file. She reports that she does not drink alcohol or use illicit drugs.   Family History:  The patient's family history includes Heart attack in her maternal uncle, maternal uncle, maternal uncle, maternal uncle, maternal uncle, maternal uncle, mother, and paternal uncle; Heart disease in her maternal uncle, maternal uncle, maternal uncle, maternal uncle, maternal uncle, maternal uncle, and mother; Pulmonary embolism in her mother.    ROS:  Please see the history of present illness.   Otherwise, review of systems are positive for none.    All other systems are reviewed and negative.    PHYSICAL EXAM: VS:  BP 134/82 mmHg  Pulse 92  Ht 5\' 4"  (1.626 m)  Wt 216 lb (97.977 kg)  BMI 37.06 kg/m2 , BMI Body mass index is 37.06 kg/(m^2). GEN: Well nourished, well developed, in no acute distress HEENT: normal Neck: no JVD, carotid bruits, or masses Cardiac: RRR; no murmurs, rubs, or gallops,no edema  Respiratory:  clear to auscultation bilaterally, normal work of breathing GI: soft, nontender, nondistended, + BS MS: no deformity or atrophy Skin: warm and dry, no rash Neuro:  Strength and sensation are intact Psych: euthymic mood, full affect   EKG:  EKG is not ordered today.    Recent Labs: No results found for requested labs within last 365 days.    Lipid Panel No results found for: CHOL, TRIG, HDL, CHOLHDL, VLDL, LDLCALC, LDLDIRECT    Wt Readings from Last 3 Encounters:  07/28/15 216 lb (97.977 kg)  06/03/14 213 lb 4 oz (96.73 kg)  05/06/14 211 lb (95.709 kg)         ASSESSMENT AND PLAN:  1.  Coronary artery disease involving native coronary arteries with unstable angina: The patient is having worsening anginal symptoms now happening with minimal activities. She has known history of coronary artery disease on cardiac catheterization from last year which showed one-vessel disease involving the mid LAD. She is already on a calcium channel blocker and did not tolerate treatment with a beta blocker due to fatigue or long-acting nitrate due to headache. Given her symptoms, I recommend proceeding with urgent cardiac catheterization and possible coronary intervention. I started her on Plavix 75 mg once daily.  2. Essential hypertension: Blood pressure is reasonably controlled. She is currently on both amlodipine and diltiazem and I might consider discontinuing amlodipine and increasing diltiazem in the near future.  3. Hyperlipidemia: Continue treatment with rosuvastatin with a target LDL of less than 70.      Disposition:   FU with me in 1 month  Signed,  Kathlyn Sacramento, MD  07/28/2015 4:38 PM    [place]

## 2015-08-15 ENCOUNTER — Ambulatory Visit: Payer: 59 | Admitting: Cardiovascular Disease

## 2015-08-19 ENCOUNTER — Telehealth: Payer: Self-pay | Admitting: Cardiovascular Disease

## 2015-08-19 ENCOUNTER — Ambulatory Visit (INDEPENDENT_AMBULATORY_CARE_PROVIDER_SITE_OTHER): Payer: 59 | Admitting: Cardiovascular Disease

## 2015-08-19 ENCOUNTER — Encounter: Payer: Self-pay | Admitting: Cardiovascular Disease

## 2015-08-19 VITALS — BP 140/90 | HR 80 | Ht 64.0 in | Wt 219.8 lb

## 2015-08-19 DIAGNOSIS — I10 Essential (primary) hypertension: Secondary | ICD-10-CM | POA: Diagnosis not present

## 2015-08-19 DIAGNOSIS — I25118 Atherosclerotic heart disease of native coronary artery with other forms of angina pectoris: Secondary | ICD-10-CM | POA: Diagnosis not present

## 2015-08-19 MED ORDER — DILTIAZEM HCL ER COATED BEADS 180 MG PO CP24
180.0000 mg | ORAL_CAPSULE | Freq: Every day | ORAL | Status: DC
Start: 2015-08-19 — End: 2016-09-25

## 2015-08-19 NOTE — Telephone Encounter (Signed)
I recommend increasing diltiazem extended release to 180 mg once daily. This will help control her blood pressure and heart rate.

## 2015-08-19 NOTE — Telephone Encounter (Signed)
Spoke with patient and increased diltiazem to 180 mg  Once daily. She verbalized understanding and had no further questions at this time.

## 2015-08-19 NOTE — Telephone Encounter (Signed)
Patient said dr. Fletcher Anon wanted to know what she was taking prn at home    Patient is taking Amiloride hcl/ hctz 5/50 po prn

## 2015-08-19 NOTE — Patient Instructions (Signed)
Medication Instructions: Continue same medications.   Labwork: None.   Procedures/Testing: None.   Follow-Up: 6 months with Dr. Fletcher Anon  Any Additional Special Instructions Will Be Listed Below (If Applicable).  Call us back to update your medication list.    If you need a refill on your cardiac medications before your next appointment, please call your pharmacy.

## 2015-08-19 NOTE — Addendum Note (Signed)
Addended by: Valora Corporal on: 08/19/2015 01:25 PM   Modules accepted: Orders

## 2015-08-19 NOTE — Progress Notes (Signed)
Cardiology Office Note   Date:  08/19/2015   ID:  Beverly Washington, DOB Apr 01, 1966, MRN [id number]  PCP:  [name], MD  Cardiologist:   Kathlyn Sacramento, MD   Chief Complaint  Patient presents with  . other    F/U s/p cardiac cath. Meds reviewed by the patient verbally. "doing well." Denies chest pain or shortness of breath.       History of Present Illness: Beverly Washington is a 50 y.o. female who presents for a follow-up visit regarding coronary artery disease. She has known history of hypertension, hyperlipidemia, borderline diabetes and strong family history of premature coronary artery disease. She was seen last year for exertional chest pain. She underwent a stress echocardiogram. However, this was limited by hypertensive response to exercise with a blood pressure of 200/100. She also had significant limiting chest tightness. Peak heart rate was 106 only. Echo images showed normal LV systolic function with no evidence of ischemia at low level of exercise. Cardiac catheterization in 05/2014 showed an 80% discrete mid LAD stenosis After the origin of second diagonal branch. The LAD was very small after the diagonal branch less than 2.5 mm. Ejection fraction was 65% with mildly elevated left ventricular end-diastolic pressure. I recommended medical therapy. She did not tolerate treatment with a beta blocker due to fatigue. Nitroglycerin gave her a headache. She was seen recently for worsening exertional chest pain in the setting of increased stress and anxiety. I proceeded with a repeat cardiac catheterization which showed stable 40% ostial LAD stenosis, 50% mid LAD disease and 70% mid to distal LAD disease after the origin of the second diagonal branch. This area appeared to be improved from last year. LV systolic function was normal. Again, the mid to distal LAD was noted to be small in diameter between 2-2.5 mm. Reports resolution of chest pain. She is feeling better.   Past  Medical History  Diagnosis Date  . Hypertension   . Hyperlipidemia   . Coronary artery disease     Cardiac catheterization in February 2016 showed an 80% discrete mid LAD stenosis at the origin of first diagonal branch. The LAD was very small after the diagonal branch less than 2.5 mm. Ejection fraction was 65% with mildly elevated left ventricular end-diastolic pressure.    Past Surgical History  Procedure Laterality Date  . Cesarean section      x3  . Abdominal plasty    . Breast reduction surgery    . Total abdominal hysterectomy    . Hernia repair    . Oophorectomy    . Eye muscle surgery    . Cholecystectomy    . Cardiac catheterization  05/07/2014    [facility]  . Cardiac catheterization Left 08/05/2015    Procedure: Left Heart Cath and Coronary Angiography;  Surgeon: Wellington Hampshire, MD;  Location: [facility];  Service: Cardiovascular;  Laterality: Left;     Current Outpatient Prescriptions  Medication Sig Dispense Refill  . amLODipine (NORVASC) 2.5 MG tablet Take 1 tablet (2.5 mg total) by mouth daily. 30 tablet 6  . aspirin EC 81 MG tablet Take 81 mg by mouth daily.     Marland Kitchen CARTIA XT 120 MG 24 hr capsule Take 120 mg by mouth daily.   6  . clopidogrel (PLAVIX) 75 MG tablet Take 1 tablet (75 mg total) by mouth daily. 30 tablet 3  . Multiple Vitamin (MULTI-VITAMINS) TABS Take 1 tablet by mouth daily.     Marland Kitchen  omeprazole (PRILOSEC) 20 MG capsule Take 1 capsule by mouth daily. Reported on 08/05/2015    . rosuvastatin (CRESTOR) 10 MG tablet Take 10 mg by mouth daily.    Marland Kitchen zolpidem (AMBIEN) 5 MG tablet Take 5 mg by mouth as needed.     No current facility-administered medications for this visit.    Allergies:   Ceftriaxone; Hydromorphone; Latex; and Olmesartan    Social History:  The patient  reports that she has never smoked. She does not have any smokeless tobacco history on file. She reports that she does not drink alcohol or use illicit drugs.   Family History:  The  patient's family history includes Heart attack in her maternal uncle, maternal uncle, maternal uncle, maternal uncle, maternal uncle, maternal uncle, mother, and paternal uncle; Heart disease in her maternal uncle, maternal uncle, maternal uncle, maternal uncle, maternal uncle, maternal uncle, and mother; Pulmonary embolism in her mother.    ROS:  Please see the history of present illness.   Otherwise, review of systems are positive for none.   All other systems are reviewed and negative.    PHYSICAL EXAM: VS:  BP 140/90 mmHg  Pulse 80  Ht 5\' 4"  (1.626 m)  Wt 219 lb 12 oz (99.678 kg)  BMI 37.70 kg/m2 , BMI Body mass index is 37.7 kg/(m^2). GEN: Well nourished, well developed, in no acute distress HEENT: normal Neck: no JVD, carotid bruits, or masses Cardiac: RRR; no murmurs, rubs, or gallops,no edema  Respiratory:  clear to auscultation bilaterally, normal work of breathing GI: soft, nontender, nondistended, + BS MS: no deformity or atrophy Skin: warm and dry, no rash Neuro:  Strength and sensation are intact Psych: euthymic mood, full affect   EKG:  EKG is not ordered today.    Recent Labs: No results found for requested labs within last 365 days.    Lipid Panel No results found for: CHOL, TRIG, HDL, CHOLHDL, VLDL, LDLCALC, LDLDIRECT    Wt Readings from Last 3 Encounters:  08/19/15 219 lb 12 oz (99.678 kg)  08/05/15 216 lb (97.977 kg)  07/28/15 216 lb (97.977 kg)         ASSESSMENT AND PLAN:  1.  Coronary artery disease involving native coronary arteries with other forms of angina:  Based on her recent cardiac catheterization, I recommend continuing medical therapy. Plavix is optional at the present time given that she did not have any stent placement. This can be continued for the next few months. I recommend intensifying her antianginal therapy. There was some confusion about her medications, asked her to call us back with clarification. She is not taking  amlodipine. I recommend increasing diltiazem to 180 mg once daily.  2. Essential hypertension: Blood pressure is elevated. Increase diltiazem as outlined above   3. Hyperlipidemia: Continue treatment with rosuvastatin with a target LDL of less than 70.     Disposition:   FU with me in 6 months  Signed,  Kathlyn Sacramento, MD  08/19/2015 9:03 AM    [place]

## 2015-11-09 ENCOUNTER — Other Ambulatory Visit: Payer: Self-pay | Admitting: Internal Medicine

## 2015-11-09 DIAGNOSIS — R1900 Intra-abdominal and pelvic swelling, mass and lump, unspecified site: Secondary | ICD-10-CM

## 2015-11-09 DIAGNOSIS — R1084 Generalized abdominal pain: Secondary | ICD-10-CM

## 2015-11-14 ENCOUNTER — Ambulatory Visit
Admission: RE | Admit: 2015-11-14 | Discharge: 2015-11-14 | Disposition: A | Discharge status: Home / Self Care | Payer: 59 | Source: Ambulatory Visit | Attending: Internal Medicine | Admitting: Internal Medicine

## 2015-11-14 ENCOUNTER — Ambulatory Visit: Admission: RE | Admit: 2015-11-14 | Payer: 59 | Source: Ambulatory Visit

## 2015-11-14 ENCOUNTER — Other Ambulatory Visit: Payer: Self-pay | Admitting: Internal Medicine

## 2015-11-14 DIAGNOSIS — R1900 Intra-abdominal and pelvic swelling, mass and lump, unspecified site: Secondary | ICD-10-CM | POA: Diagnosis not present

## 2015-11-14 DIAGNOSIS — R1084 Generalized abdominal pain: Secondary | ICD-10-CM | POA: Insufficient documentation

## 2015-12-16 DIAGNOSIS — Z8679 Personal history of other diseases of the circulatory system: Secondary | ICD-10-CM | POA: Diagnosis not present

## 2015-12-16 DIAGNOSIS — E785 Hyperlipidemia, unspecified: Secondary | ICD-10-CM | POA: Diagnosis not present

## 2015-12-16 DIAGNOSIS — E119 Type 2 diabetes mellitus without complications: Secondary | ICD-10-CM | POA: Diagnosis not present

## 2015-12-19 ENCOUNTER — Other Ambulatory Visit (HOSPITAL_COMMUNITY): Payer: Self-pay | Admitting: Surgery

## 2015-12-19 ENCOUNTER — Other Ambulatory Visit: Payer: Self-pay | Admitting: Surgery

## 2015-12-19 DIAGNOSIS — E669 Obesity, unspecified: Secondary | ICD-10-CM

## 2015-12-21 ENCOUNTER — Other Ambulatory Visit: Payer: Self-pay | Admitting: Internal Medicine

## 2015-12-21 ENCOUNTER — Ambulatory Visit
Admission: RE | Admit: 2015-12-21 | Discharge: 2015-12-21 | Disposition: A | Discharge status: Home / Self Care | Payer: 59 | Source: Ambulatory Visit | Attending: Surgery | Admitting: Surgery

## 2015-12-21 DIAGNOSIS — Z01818 Encounter for other preprocedural examination: Secondary | ICD-10-CM | POA: Insufficient documentation

## 2015-12-21 DIAGNOSIS — Z6839 Body mass index (BMI) 39.0-39.9, adult: Secondary | ICD-10-CM | POA: Insufficient documentation

## 2015-12-21 DIAGNOSIS — Z1231 Encounter for screening mammogram for malignant neoplasm of breast: Secondary | ICD-10-CM

## 2015-12-28 ENCOUNTER — Ambulatory Visit
Admission: RE | Admit: 2015-12-28 | Discharge: 2015-12-28 | Disposition: A | Discharge status: Home / Self Care | Payer: 59 | Source: Ambulatory Visit | Attending: Surgery | Admitting: Surgery

## 2015-12-28 ENCOUNTER — Other Ambulatory Visit: Payer: Self-pay | Admitting: Surgery

## 2015-12-28 DIAGNOSIS — K219 Gastro-esophageal reflux disease without esophagitis: Secondary | ICD-10-CM | POA: Insufficient documentation

## 2015-12-28 DIAGNOSIS — Z01818 Encounter for other preprocedural examination: Secondary | ICD-10-CM | POA: Diagnosis not present

## 2016-01-04 DIAGNOSIS — Z9889 Other specified postprocedural states: Secondary | ICD-10-CM | POA: Diagnosis not present

## 2016-01-04 DIAGNOSIS — R1909 Other intra-abdominal and pelvic swelling, mass and lump: Secondary | ICD-10-CM | POA: Diagnosis not present

## 2016-01-04 DIAGNOSIS — Z8719 Personal history of other diseases of the digestive system: Secondary | ICD-10-CM | POA: Diagnosis not present

## 2016-01-10 ENCOUNTER — Ambulatory Visit
Admission: RE | Admit: 2016-01-10 | Discharge: 2016-01-10 | Disposition: A | Discharge status: Home / Self Care | Payer: 59 | Source: Ambulatory Visit | Attending: Internal Medicine | Admitting: Internal Medicine

## 2016-01-10 DIAGNOSIS — R1084 Generalized abdominal pain: Secondary | ICD-10-CM | POA: Insufficient documentation

## 2016-01-10 DIAGNOSIS — R935 Abnormal findings on diagnostic imaging of other abdominal regions, including retroperitoneum: Secondary | ICD-10-CM | POA: Diagnosis not present

## 2016-01-10 DIAGNOSIS — R1900 Intra-abdominal and pelvic swelling, mass and lump, unspecified site: Secondary | ICD-10-CM | POA: Insufficient documentation

## 2016-01-10 MED ORDER — IOPAMIDOL (ISOVUE-370) INJECTION 76%
100.0000 mL | Freq: Once | INTRAVENOUS | Status: AC | PRN
  Administered 2016-01-10: 100 mL via INTRAVENOUS

## 2016-01-11 ENCOUNTER — Encounter: Payer: 59 | Attending: Surgery | Admitting: Skilled Nursing Facility1

## 2016-01-11 ENCOUNTER — Encounter: Payer: Self-pay | Admitting: Skilled Nursing Facility1

## 2016-01-11 DIAGNOSIS — Z713 Dietary counseling and surveillance: Secondary | ICD-10-CM | POA: Diagnosis not present

## 2016-01-11 DIAGNOSIS — E6609 Other obesity due to excess calories: Secondary | ICD-10-CM

## 2016-01-11 NOTE — Patient Instructions (Signed)
Follow Pre-Op Goals Try Protein Shakes Call [facility] at [phone] when surgery is scheduled to enroll in Pre-Op Class  Things to remember:  Please always be honest with us. We want to support you!  If you have any questions or concerns in between appointments, please call or email Liz, Leslie, or Laurie.  The diet after surgery will be high protein and low in carbohydrate.  Vitamins and calcium need to be taken for the rest of your life.  Feel free to include support people in any classes or appointments.   Supplement recommendations:  Before Surgery   1 Complete Multivitamin with Iron  3000 IU Vitamin D3  After Surgery   2 Chewable Multivitamins  **Best Choice - Bariatric Advantage Advanced Multi EA      3 Chewable Calcium (500 mg each, total 1200-1500 mg per day)  **Best Choice - Celebrate, Bariatric Advantage, or Wellesse  Other Options:    2 Flinstones Complete + up to 100 mg Thiamin + 2000-3000 IU Vitamin D3 + 350-500 mcg Vitamin B12 + 30-45 mg Iron (with history of deficiency)  2 Celebrate MultiComplete with 18 mg Iron (this provides 6000 IU of  Vitamin D3)  4 Celebrate Essential Multi 2 in 1 (has calcium) + 18-60 mg separate  iron  Vitamins and Calcium are available at:   [facility]   [address], [place],  [address]   [email or web address]  [email or web address]  [email or web address]   

## 2016-01-11 NOTE — Progress Notes (Signed)
  Pre-Op Assessment Visit:  Pre-Operative Sleeve Gastrectomy Surgery  Medical Nutrition Therapy:  Appt start time: 2:00  End time:  3:00  Patient was seen on 01/11/2016 for Pre-Operative Nutrition Assessment. Assessment and letter of approval faxed to [facility] Bariatric Surgery Program coordinator on 01/11/2016.  Pt states since her mesh surgery she has gained about 18 pounds.  Preferred Learning Style:   No preference indicated Learning Readiness:   Ready  Handouts given during visit include:  Pre-Op Goals Bariatric Surgery Protein Shakes  During the appointment today the following Pre-Op Goals were reviewed with the patient: Maintain or lose weight as instructed by your surgeon Make healthy food choices Begin to limit portion sizes Limited concentrated sugars and fried foods Keep fat/sugar in the single digits per serving on   food labels Practice CHEWING your food  (aim for 30 chews per bite or until applesauce consistency) Practice not drinking 15 minutes before, during, and 30 minutes after each meal/snack Avoid all carbonated beverages  Avoid/limit caffeinated beverages  Avoid all sugar-sweetened beverages Consume 3 meals per day; eat every 3-5 hours Make a list of non-food related activities Aim for 64-100 ounces of FLUID daily  Aim for at least 60-80 grams of PROTEIN daily Look for a liquid protein source that contain ?15 g protein and ?5 g carbohydrate  (ex: shakes, drinks, shots)  Patient-Centered Goals: 10/10 specific/non-scale and confidence/importance scale 1-10  Demonstrated degree of understanding via:  Teach Back  Teaching Method Utilized:  Visual Auditory Hands on  Barriers to learning/adherence to lifestyle change: none identified   [facility] to call the [facility] to enroll in Pre-Op and Post-Op Nutrition Education when surgery date is scheduled.

## 2016-01-18 ENCOUNTER — Ambulatory Visit: Payer: 59

## 2016-01-25 ENCOUNTER — Ambulatory Visit
Admission: RE | Admit: 2016-01-25 | Discharge: 2016-01-25 | Disposition: A | Discharge status: Home / Self Care | Payer: 59 | Source: Ambulatory Visit | Attending: Internal Medicine | Admitting: Internal Medicine

## 2016-01-25 DIAGNOSIS — Z1231 Encounter for screening mammogram for malignant neoplasm of breast: Secondary | ICD-10-CM | POA: Diagnosis not present

## 2016-02-06 ENCOUNTER — Ambulatory Visit: Payer: 59

## 2016-02-07 ENCOUNTER — Ambulatory Visit (INDEPENDENT_AMBULATORY_CARE_PROVIDER_SITE_OTHER): Payer: 59 | Admitting: Psychiatry

## 2016-02-07 DIAGNOSIS — F509 Eating disorder, unspecified: Secondary | ICD-10-CM | POA: Diagnosis not present

## 2016-02-16 ENCOUNTER — Ambulatory Visit (INDEPENDENT_AMBULATORY_CARE_PROVIDER_SITE_OTHER): Payer: 59 | Admitting: Psychiatry

## 2016-02-16 DIAGNOSIS — F509 Eating disorder, unspecified: Secondary | ICD-10-CM | POA: Diagnosis not present

## 2016-03-06 NOTE — Progress Notes (Signed)
Scheduling pre op- please place SURGICAL ORDERS IN EPIC  Thanks 

## 2016-03-09 DIAGNOSIS — Z961 Presence of intraocular lens: Secondary | ICD-10-CM | POA: Diagnosis not present

## 2016-03-09 DIAGNOSIS — H20021 Recurrent acute iridocyclitis, right eye: Secondary | ICD-10-CM | POA: Diagnosis not present

## 2016-03-09 DIAGNOSIS — I10 Essential (primary) hypertension: Secondary | ICD-10-CM | POA: Diagnosis not present

## 2016-03-09 DIAGNOSIS — H02401 Unspecified ptosis of right eyelid: Secondary | ICD-10-CM | POA: Diagnosis not present

## 2016-03-12 ENCOUNTER — Encounter: Payer: 59 | Attending: Surgery | Admitting: Dietician

## 2016-03-12 DIAGNOSIS — Z713 Dietary counseling and surveillance: Secondary | ICD-10-CM | POA: Diagnosis not present

## 2016-03-13 ENCOUNTER — Encounter: Payer: Self-pay | Admitting: Dietician

## 2016-03-13 NOTE — Progress Notes (Signed)
  Pre-Operative Nutrition Class:  Appt start time: 8569   End time:  1830.  Patient was seen on 03/12/2016 for Pre-Operative Bariatric Surgery Education at the [facility].   Surgery date: 03/19/2016 Surgery type: sleeve gastrectomy Start weight at [facility]: 227 lbs on 01/11/2016 Weight today: 231 lbs  TANITA  BODY COMP RESULTS  03/12/16   BMI (kg/m^2) n/a   Fat Mass (lbs)    Fat Free Mass (lbs)    Total Body Water (lbs)    Samples given per MNT protocol. Patient educated on appropriate usage: Premier protein shake (strawberry - qty 1) Lot #: [id number] Exp: 12/2016  Unjury Protein Powder (chicken soup - qty 1) Lot #: [id number] Exp: 05/2017  The following the learning objectives were met by the patient during this course:  Identify Pre-Op Dietary Goals and will begin 2 weeks pre-operatively  Identify appropriate sources of fluids and proteins   State protein recommendations and appropriate sources pre and post-operatively  Identify Post-Operative Dietary Goals and will follow for 2 weeks post-operatively  Identify appropriate multivitamin and calcium sources  Describe the need for physical activity post-operatively and will follow MD recommendations  State when to call healthcare provider regarding medication questions or post-operative complications  Handouts given during class include:  Pre-Op Bariatric Surgery Diet Handout  Protein Shake Handout  Post-Op Bariatric Surgery Nutrition Handout  [facility] Program Information Flyer  Support Group Information Flyer  [facility] Bariatric Supplements Price List  Follow-Up Plan: Patient will follow-up at [facility] 2 weeks post operatively for diet advancement per MD.

## 2016-03-13 NOTE — Progress Notes (Signed)
Pt is scheduled for preop appt for 03/16/2016; please place surgical orders in epic. Thanks.

## 2016-03-14 ENCOUNTER — Ambulatory Visit: Payer: Self-pay | Admitting: Surgery

## 2016-03-15 NOTE — Progress Notes (Signed)
CARDIAC CATH 08-05-15 EPIC LOV CARDIOL DR ARIDA 08-19-15 EPIC CHEST XRAY 12-21-15

## 2016-03-15 NOTE — Patient Instructions (Addendum)
Beverly Washington  03/15/2016   Your procedure is scheduled on: 03-19-16  Report to [facility]  Entrance take [facility]  elevators to 3rd floor to  [place] at 515  AM.  Call this number if you have problems the morning of surgery [phone]   Remember: ONLY 1 PERSON MAY GO WITH YOU TO SHORT STAY TO GET  READY MORNING OF YOUR SURGERY.  Do not eat food or drink liquids :After Midnight.     Take these medicines the morning of surgery with A SIP OF WATER: DILTIAZEM (CARDIZEM), eye drop                               You may not have any metal on your body including hair pins and              piercings  Do not wear jewelry, make-up, lotions, powders or perfumes, deodorant             Do not wear nail polish.  Do not shave  48 hours prior to surgery.              Men may shave face and neck.   Do not bring valuables to the hospital. [name].  Contacts, dentures or bridgework may not be worn into surgery.  Leave suitcase in the car. After surgery it may be brought to your room.                  Please read over the following fact sheets you were given: _____________________________________________________________________             [facility] - Preparing for Surgery Before surgery, you can play an important role.  Because skin is not sterile, your skin needs to be as free of germs as possible.  You can reduce the number of germs on your skin by washing with CHG (chlorahexidine gluconate) soap before surgery.  CHG is an antiseptic cleaner which kills germs and bonds with the skin to continue killing germs even after washing. Please DO NOT use if you have an allergy to CHG or antibacterial soaps.  If your skin becomes reddened/irritated stop using the CHG and inform your nurse when you arrive at Short Stay. Do not shave (including legs and underarms) for at least 48 hours prior to the first CHG shower.   You may shave your face/neck. Please follow these instructions carefully:  1.  Shower with CHG Soap the night before surgery and the  morning of Surgery.  2.  If you choose to wash your hair, wash your hair first as usual with your  normal  shampoo.  3.  After you shampoo, rinse your hair and body thoroughly to remove the  shampoo.                           4.  Use CHG as you would any other liquid soap.  You can apply chg directly  to the skin and wash                       Gently with a scrungie or clean washcloth.  5.  Apply  the CHG Soap to your body ONLY FROM THE NECK DOWN.   Do not use on face/ open                           Wound or open sores. Avoid contact with eyes, ears mouth and genitals (private parts).                       Wash face,  Genitals (private parts) with your normal soap.             6.  Wash thoroughly, paying special attention to the area where your surgery  will be performed.  7.  Thoroughly rinse your body with warm water from the neck down.  8.  DO NOT shower/wash with your normal soap after using and rinsing off  the CHG Soap.                9.  Pat yourself dry with a clean towel.            10.  Wear clean pajamas.            11.  Place clean sheets on your bed the night of your first shower and do not  sleep with pets. Day of Surgery : Do not apply any lotions/deodorants the morning of surgery.  Please wear clean clothes to the hospital/surgery center.  FAILURE TO FOLLOW THESE INSTRUCTIONS MAY RESULT IN THE CANCELLATION OF YOUR SURGERY PATIENT SIGNATURE_________________________________  NURSE SIGNATURE__________________________________  ________________________________________________________________________

## 2016-03-16 ENCOUNTER — Encounter (HOSPITAL_COMMUNITY): Payer: Self-pay

## 2016-03-16 ENCOUNTER — Encounter (HOSPITAL_COMMUNITY)
Admission: RE | Admit: 2016-03-16 | Discharge: 2016-03-16 | Disposition: A | Discharge status: Home / Self Care | Payer: 59 | Source: Ambulatory Visit | Attending: Surgery | Admitting: Surgery

## 2016-03-16 DIAGNOSIS — Z6841 Body Mass Index (BMI) 40.0 and over, adult: Secondary | ICD-10-CM | POA: Diagnosis not present

## 2016-03-16 DIAGNOSIS — E669 Obesity, unspecified: Secondary | ICD-10-CM | POA: Insufficient documentation

## 2016-03-16 DIAGNOSIS — I10 Essential (primary) hypertension: Secondary | ICD-10-CM | POA: Diagnosis not present

## 2016-03-16 DIAGNOSIS — Z01812 Encounter for preprocedural laboratory examination: Secondary | ICD-10-CM

## 2016-03-16 DIAGNOSIS — R7303 Prediabetes: Secondary | ICD-10-CM | POA: Diagnosis not present

## 2016-03-16 DIAGNOSIS — I2510 Atherosclerotic heart disease of native coronary artery without angina pectoris: Secondary | ICD-10-CM | POA: Diagnosis not present

## 2016-03-16 DIAGNOSIS — Z8249 Family history of ischemic heart disease and other diseases of the circulatory system: Secondary | ICD-10-CM | POA: Diagnosis not present

## 2016-03-16 DIAGNOSIS — E785 Hyperlipidemia, unspecified: Secondary | ICD-10-CM | POA: Diagnosis not present

## 2016-03-16 HISTORY — DX: Prediabetes: R73.03

## 2016-03-16 HISTORY — DX: Unspecified iridocyclitis: H20.9

## 2016-03-16 HISTORY — DX: Adverse effect of unspecified anesthetic, initial encounter: T41.45XA

## 2016-03-16 LAB — CBC WITH DIFFERENTIAL/PLATELET
BASOS ABS: 0 10*3/uL (ref 0.0–0.1)
BASOS PCT: 0 %
EOS ABS: 0.2 10*3/uL (ref 0.0–0.7)
Eosinophils Relative: 2 %
HCT: 44 % (ref 36.0–46.0)
HEMOGLOBIN: 15.4 g/dL — AB (ref 12.0–15.0)
Lymphocytes Relative: 24 %
Lymphs Abs: 2.1 10*3/uL (ref 0.7–4.0)
MCH: 32 pg (ref 26.0–34.0)
MCHC: 35 g/dL (ref 30.0–36.0)
MCV: 91.3 fL (ref 78.0–100.0)
Monocytes Absolute: 0.8 10*3/uL (ref 0.1–1.0)
Monocytes Relative: 9 %
NEUTROS PCT: 65 %
Neutro Abs: 5.7 10*3/uL (ref 1.7–7.7)
Platelets: 263 10*3/uL (ref 150–400)
RBC: 4.82 MIL/uL (ref 3.87–5.11)
RDW: 13 % (ref 11.5–15.5)
WBC: 8.8 10*3/uL (ref 4.0–10.5)

## 2016-03-16 LAB — COMPREHENSIVE METABOLIC PANEL
ALBUMIN: 4.3 g/dL (ref 3.5–5.0)
ALK PHOS: 63 U/L (ref 38–126)
ALT: 113 U/L — AB (ref 14–54)
ANION GAP: 6 (ref 5–15)
AST: 74 U/L — AB (ref 15–41)
BUN: 15 mg/dL (ref 6–20)
CALCIUM: 9.1 mg/dL (ref 8.9–10.3)
CO2: 24 mmol/L (ref 22–32)
Chloride: 109 mmol/L (ref 101–111)
Creatinine, Ser: 0.59 mg/dL (ref 0.44–1.00)
GFR calc Af Amer: >60 mL/min (ref >60)
GFR calc non Af Amer: >60 mL/min (ref >60)
GLUCOSE: 118 mg/dL — AB (ref 65–99)
Potassium: 4.5 mmol/L (ref 3.5–5.1)
SODIUM: 139 mmol/L (ref 135–145)
Total Bilirubin: 0.6 mg/dL (ref 0.3–1.2)
Total Protein: 7.6 g/dL (ref 6.5–8.1)

## 2016-03-16 NOTE — Anesthesia Preprocedure Evaluation (Signed)
Anesthesia Evaluation  Patient identified by MRN, date of birth, ID band Patient awake    Reviewed: Allergy & Precautions, NPO status , Patient's Chart, lab work & pertinent test results  Airway Mallampati: II  TM Distance: >3 FB Neck ROM: Full    Dental no notable dental hx.    Pulmonary neg pulmonary ROS,    Pulmonary exam normal breath sounds clear to auscultation       Cardiovascular hypertension, + CAD  Normal cardiovascular exam Rhythm:Regular Rate:Normal     Neuro/Psych negative neurological ROS  negative psych ROS   GI/Hepatic negative GI ROS, Neg liver ROS,   Endo/Other  negative endocrine ROS  Renal/GU negative Renal ROS  negative genitourinary   Musculoskeletal negative musculoskeletal ROS (+)   Abdominal   Peds negative pediatric ROS (+)  Hematology negative hematology ROS (+)   Anesthesia Other Findings   Reproductive/Obstetrics negative OB ROS                             Anesthesia Physical Anesthesia Plan  ASA: III  Anesthesia Plan: General   Post-op Pain Management:    Induction: Intravenous  Airway Management Planned: Oral ETT  Additional Equipment:   Intra-op Plan:   Post-operative Plan: Extubation in OR  Informed Consent: I have reviewed the patients History and Physical, chart, labs and discussed the procedure including the risks, benefits and alternatives for the proposed anesthesia with the patient or authorized representative who has indicated his/her understanding and acceptance.   Dental advisory given  Plan Discussed with: CRNA and Surgeon  Anesthesia Plan Comments:         Anesthesia Quick Evaluation

## 2016-03-16 NOTE — Progress Notes (Signed)
03-16-16 ekg [facility] on chart

## 2016-03-17 LAB — HEMOGLOBIN A1C
Hgb A1c MFr Bld: 6.2 % — ABNORMAL HIGH (ref 4.8–5.6)
Mean Plasma Glucose: 131 mg/dL

## 2016-03-19 ENCOUNTER — Inpatient Hospital Stay (HOSPITAL_COMMUNITY): Payer: 59 | Admitting: Anesthesiology

## 2016-03-19 ENCOUNTER — Encounter (HOSPITAL_COMMUNITY)
Admission: RE | Disposition: A | Discharge status: Home / Self Care | Payer: Self-pay | Source: Ambulatory Visit | Attending: Surgery

## 2016-03-19 ENCOUNTER — Inpatient Hospital Stay (HOSPITAL_COMMUNITY)
Admission: RE | Admit: 2016-03-19 | Discharge: 2016-03-20 | DRG: 641 | Disposition: A | Discharge status: Home / Self Care | Payer: 59 | Source: Ambulatory Visit | Attending: Surgery | Admitting: Surgery

## 2016-03-19 ENCOUNTER — Encounter (HOSPITAL_COMMUNITY): Payer: Self-pay | Admitting: *Deleted

## 2016-03-19 DIAGNOSIS — E785 Hyperlipidemia, unspecified: Secondary | ICD-10-CM | POA: Diagnosis present

## 2016-03-19 DIAGNOSIS — R7303 Prediabetes: Secondary | ICD-10-CM | POA: Diagnosis present

## 2016-03-19 DIAGNOSIS — I10 Essential (primary) hypertension: Secondary | ICD-10-CM | POA: Diagnosis present

## 2016-03-19 DIAGNOSIS — Z6841 Body Mass Index (BMI) 40.0 and over, adult: Secondary | ICD-10-CM | POA: Diagnosis not present

## 2016-03-19 DIAGNOSIS — E119 Type 2 diabetes mellitus without complications: Secondary | ICD-10-CM | POA: Diagnosis not present

## 2016-03-19 DIAGNOSIS — Z9884 Bariatric surgery status: Secondary | ICD-10-CM

## 2016-03-19 DIAGNOSIS — Z8249 Family history of ischemic heart disease and other diseases of the circulatory system: Secondary | ICD-10-CM | POA: Diagnosis not present

## 2016-03-19 DIAGNOSIS — I2510 Atherosclerotic heart disease of native coronary artery without angina pectoris: Secondary | ICD-10-CM | POA: Diagnosis present

## 2016-03-19 DIAGNOSIS — K2950 Unspecified chronic gastritis without bleeding: Secondary | ICD-10-CM | POA: Diagnosis not present

## 2016-03-19 DIAGNOSIS — E7800 Pure hypercholesterolemia, unspecified: Secondary | ICD-10-CM | POA: Diagnosis not present

## 2016-03-19 HISTORY — PX: UPPER GI ENDOSCOPY: SHX6162

## 2016-03-19 HISTORY — PX: LAPAROSCOPIC GASTRIC SLEEVE RESECTION: SHX5895

## 2016-03-19 HISTORY — PX: LAPAROSCOPIC LYSIS OF ADHESIONS: SHX5905

## 2016-03-19 LAB — CBC
HEMATOCRIT: 42.2 % (ref 36.0–46.0)
HEMOGLOBIN: 15.2 g/dL — AB (ref 12.0–15.0)
MCH: 32.1 pg (ref 26.0–34.0)
MCHC: 36 g/dL (ref 30.0–36.0)
MCV: 89.2 fL (ref 78.0–100.0)
Platelets: 212 10*3/uL (ref 150–400)
RBC: 4.73 MIL/uL (ref 3.87–5.11)
RDW: 12.8 % (ref 11.5–15.5)
WBC: 15.2 10*3/uL — ABNORMAL HIGH (ref 4.0–10.5)

## 2016-03-19 LAB — HEMOGLOBIN AND HEMATOCRIT, BLOOD
HCT: 44.1 % (ref 36.0–46.0)
HEMOGLOBIN: 15.1 g/dL — AB (ref 12.0–15.0)

## 2016-03-19 LAB — GLUCOSE, CAPILLARY: GLUCOSE-CAPILLARY: 141 mg/dL — AB (ref 65–99)

## 2016-03-19 LAB — CREATININE, SERUM
Creatinine, Ser: 0.74 mg/dL (ref 0.44–1.00)
GFR calc Af Amer: >60 mL/min (ref >60)
GFR calc non Af Amer: >60 mL/min (ref >60)

## 2016-03-19 SURGERY — GASTRECTOMY, SLEEVE, LAPAROSCOPIC
Anesthesia: General | Site: Abdomen

## 2016-03-19 MED ORDER — DEXAMETHASONE SODIUM PHOSPHATE 10 MG/ML IJ SOLN
INTRAMUSCULAR | Status: DC | PRN
  Administered 2016-03-19: 10 mg via INTRAVENOUS

## 2016-03-19 MED ORDER — PANTOPRAZOLE SODIUM 40 MG IV SOLR
40.0000 mg | Freq: Every day | INTRAVENOUS | Status: DC
  Administered 2016-03-19: 40 mg via INTRAVENOUS
  Filled 2016-03-19: qty 40

## 2016-03-19 MED ORDER — SUFENTANIL CITRATE 50 MCG/ML IV SOLN
INTRAVENOUS | Status: DC | PRN
  Administered 2016-03-19: 2 ug via INTRAVENOUS

## 2016-03-19 MED ORDER — ACETAMINOPHEN 10 MG/ML IV SOLN
INTRAVENOUS | Status: DC | PRN
  Administered 2016-03-19: 1000 mg via INTRAVENOUS

## 2016-03-19 MED ORDER — ROCURONIUM BROMIDE 50 MG/5ML IV SOSY
PREFILLED_SYRINGE | INTRAVENOUS | Status: AC
  Filled 2016-03-19: qty 5

## 2016-03-19 MED ORDER — MORPHINE SULFATE (PF) 10 MG/ML IV SOLN
1.0000 mg | INTRAVENOUS | Status: DC | PRN
  Administered 2016-03-19 (×2): 2 mg via INTRAVENOUS

## 2016-03-19 MED ORDER — CIPROFLOXACIN IN D5W 400 MG/200ML IV SOLN
INTRAVENOUS | Status: AC
  Filled 2016-03-19: qty 200

## 2016-03-19 MED ORDER — DEXAMETHASONE SODIUM PHOSPHATE 10 MG/ML IJ SOLN
INTRAMUSCULAR | Status: AC
  Filled 2016-03-19: qty 1

## 2016-03-19 MED ORDER — PROMETHAZINE HCL 25 MG/ML IJ SOLN
6.2500 mg | INTRAMUSCULAR | Status: DC | PRN
  Administered 2016-03-19: 6.25 mg via INTRAVENOUS

## 2016-03-19 MED ORDER — 0.9 % SODIUM CHLORIDE (POUR BTL) OPTIME
TOPICAL | Status: DC | PRN
  Administered 2016-03-19: 1000 mL

## 2016-03-19 MED ORDER — FENTANYL CITRATE (PF) 100 MCG/2ML IJ SOLN: 12.5000 ug | INTRAMUSCULAR | Status: DC | PRN

## 2016-03-19 MED ORDER — PROMETHAZINE HCL 25 MG/ML IJ SOLN
INTRAMUSCULAR | Status: AC
  Filled 2016-03-19: qty 1

## 2016-03-19 MED ORDER — SUGAMMADEX SODIUM 200 MG/2ML IV SOLN
INTRAVENOUS | Status: DC | PRN
  Administered 2016-03-19: 200 mg via INTRAVENOUS

## 2016-03-19 MED ORDER — OXYCODONE HCL 5 MG/5ML PO SOLN
5.0000 mg | ORAL | Status: DC | PRN
  Administered 2016-03-20: 5 mg via ORAL
  Filled 2016-03-19: qty 5

## 2016-03-19 MED ORDER — KCL IN DEXTROSE-NACL 20-5-0.45 MEQ/L-%-% IV SOLN
INTRAVENOUS | Status: DC
  Administered 2016-03-19 (×2): via INTRAVENOUS
  Filled 2016-03-19 (×3): qty 1000

## 2016-03-19 MED ORDER — CEFOTETAN DISODIUM-DEXTROSE 2-2.08 GM-% IV SOLR: 2.0000 g | INTRAVENOUS | Status: DC

## 2016-03-19 MED ORDER — PREMIER PROTEIN SHAKE: 2.0000 [oz_av] | ORAL | Status: DC

## 2016-03-19 MED ORDER — HEPARIN SODIUM (PORCINE) 5000 UNIT/ML IJ SOLN
5000.0000 [IU] | INTRAMUSCULAR | Status: AC
  Administered 2016-03-19: 5000 [IU] via SUBCUTANEOUS
  Filled 2016-03-19: qty 1

## 2016-03-19 MED ORDER — KCL IN DEXTROSE-NACL 20-5-0.45 MEQ/L-%-% IV SOLN
INTRAVENOUS | Status: AC
  Filled 2016-03-19: qty 1000

## 2016-03-19 MED ORDER — PROPOFOL 10 MG/ML IV BOLUS
INTRAVENOUS | Status: AC
  Filled 2016-03-19: qty 40

## 2016-03-19 MED ORDER — EPHEDRINE SULFATE 50 MG/ML IJ SOLN
INTRAMUSCULAR | Status: DC | PRN
  Administered 2016-03-19: 10 mg via INTRAVENOUS

## 2016-03-19 MED ORDER — SUCCINYLCHOLINE CHLORIDE 20 MG/ML IJ SOLN
INTRAMUSCULAR | Status: DC | PRN
  Administered 2016-03-19: 120 mg via INTRAVENOUS

## 2016-03-19 MED ORDER — KETOROLAC TROMETHAMINE 30 MG/ML IJ SOLN: 30.0000 mg | Freq: Once | INTRAMUSCULAR | Status: DC | PRN

## 2016-03-19 MED ORDER — SODIUM CHLORIDE 0.9 % IJ SOLN
INTRAMUSCULAR | Status: AC
  Filled 2016-03-19: qty 50

## 2016-03-19 MED ORDER — DILTIAZEM HCL ER COATED BEADS 180 MG PO CP24: 180.0000 mg | ORAL_CAPSULE | Freq: Every day | ORAL | Status: DC

## 2016-03-19 MED ORDER — ONDANSETRON HCL 4 MG/2ML IJ SOLN: 4.0000 mg | INTRAMUSCULAR | Status: DC | PRN

## 2016-03-19 MED ORDER — SUCCINYLCHOLINE CHLORIDE 200 MG/10ML IV SOSY
PREFILLED_SYRINGE | INTRAVENOUS | Status: AC
  Filled 2016-03-19: qty 10

## 2016-03-19 MED ORDER — ACETAMINOPHEN 160 MG/5ML PO SOLN: 325.0000 mg | ORAL | Status: DC | PRN

## 2016-03-19 MED ORDER — ARTIFICIAL TEARS OP OINT
TOPICAL_OINTMENT | OPHTHALMIC | Status: AC
  Filled 2016-03-19: qty 3.5

## 2016-03-19 MED ORDER — ACETAMINOPHEN 160 MG/5ML PO SOLN: 650.0000 mg | ORAL | Status: DC | PRN

## 2016-03-19 MED ORDER — HEPARIN SODIUM (PORCINE) 5000 UNIT/ML IJ SOLN
5000.0000 [IU] | Freq: Three times a day (TID) | INTRAMUSCULAR | Status: DC
  Administered 2016-03-19 – 2016-03-20 (×2): 5000 [IU] via SUBCUTANEOUS
  Filled 2016-03-19 (×2): qty 1

## 2016-03-19 MED ORDER — EPHEDRINE 5 MG/ML INJ
INTRAVENOUS | Status: AC
  Filled 2016-03-19: qty 10

## 2016-03-19 MED ORDER — ACETAMINOPHEN 10 MG/ML IV SOLN
INTRAVENOUS | Status: AC
  Filled 2016-03-19: qty 100

## 2016-03-19 MED ORDER — CHLORHEXIDINE GLUCONATE 4 % EX LIQD: 60.0000 mL | Freq: Once | CUTANEOUS | Status: DC

## 2016-03-19 MED ORDER — ONDANSETRON HCL 4 MG/2ML IJ SOLN
INTRAMUSCULAR | Status: AC
  Filled 2016-03-19: qty 2

## 2016-03-19 MED ORDER — LACTATED RINGERS IR SOLN
Status: DC | PRN
  Administered 2016-03-19: 3000 mL

## 2016-03-19 MED ORDER — FENTANYL CITRATE (PF) 100 MCG/2ML IJ SOLN
INTRAMUSCULAR | Status: DC | PRN
  Administered 2016-03-19: 100 ug via INTRAVENOUS
  Administered 2016-03-19 (×3): 50 ug via INTRAVENOUS
  Administered 2016-03-19: 100 ug via INTRAVENOUS
  Administered 2016-03-19 (×3): 50 ug via INTRAVENOUS

## 2016-03-19 MED ORDER — ROCURONIUM BROMIDE 100 MG/10ML IV SOLN
INTRAVENOUS | Status: DC | PRN
  Administered 2016-03-19 (×2): 20 mg via INTRAVENOUS

## 2016-03-19 MED ORDER — DILTIAZEM HCL 60 MG PO TABS
120.0000 mg | ORAL_TABLET | Freq: Every day | ORAL | Status: DC
  Administered 2016-03-20: 120 mg via ORAL
  Filled 2016-03-19: qty 2

## 2016-03-19 MED ORDER — APREPITANT 80 MG PO CAPS
80.0000 mg | ORAL_CAPSULE | ORAL | Status: AC
  Administered 2016-03-19: 80 mg via ORAL
  Filled 2016-03-19: qty 1

## 2016-03-19 MED ORDER — ONDANSETRON HCL 4 MG/2ML IJ SOLN
INTRAMUSCULAR | Status: DC | PRN
  Administered 2016-03-19: 4 mg via INTRAVENOUS

## 2016-03-19 MED ORDER — MORPHINE SULFATE (PF) 10 MG/ML IV SOLN
INTRAVENOUS | Status: AC
  Administered 2016-03-19: 2 mg via INTRAVENOUS
  Filled 2016-03-19: qty 1

## 2016-03-19 MED ORDER — PROPOFOL 10 MG/ML IV BOLUS
INTRAVENOUS | Status: DC | PRN
  Administered 2016-03-19: 50 mg via INTRAVENOUS
  Administered 2016-03-19: 250 mg via INTRAVENOUS

## 2016-03-19 MED ORDER — PHENYLEPHRINE HCL 10 MG/ML IJ SOLN
INTRAMUSCULAR | Status: DC | PRN
  Administered 2016-03-19: 80 ug via INTRAVENOUS
  Administered 2016-03-19: 120 ug via INTRAVENOUS
  Administered 2016-03-19: 80 ug via INTRAVENOUS
  Administered 2016-03-19: 120 ug via INTRAVENOUS

## 2016-03-19 MED ORDER — LIDOCAINE HCL (CARDIAC) 20 MG/ML IV SOLN
INTRAVENOUS | Status: DC | PRN
  Administered 2016-03-19: 25 mg via INTRATRACHEAL
  Administered 2016-03-19: 75 mg via INTRAVENOUS

## 2016-03-19 MED ORDER — MIDAZOLAM HCL 2 MG/2ML IJ SOLN
INTRAMUSCULAR | Status: AC
  Filled 2016-03-19: qty 2

## 2016-03-19 MED ORDER — PHENYLEPHRINE 40 MCG/ML (10ML) SYRINGE FOR IV PUSH (FOR BLOOD PRESSURE SUPPORT)
PREFILLED_SYRINGE | INTRAVENOUS | Status: AC
Start: 2016-03-19 — End: 2016-03-19
  Filled 2016-03-19: qty 10

## 2016-03-19 MED ORDER — CIPROFLOXACIN IN D5W 400 MG/200ML IV SOLN
400.0000 mg | Freq: Once | INTRAVENOUS | Status: AC
  Administered 2016-03-19: 400 mg via INTRAVENOUS
  Filled 2016-03-19: qty 200

## 2016-03-19 MED ORDER — ACETAMINOPHEN 10 MG/ML IV SOLN: 1000.0000 mg | Freq: Once | INTRAVENOUS | Status: DC

## 2016-03-19 MED ORDER — BUPIVACAINE LIPOSOME 1.3 % IJ SUSP
20.0000 mL | Freq: Once | INTRAMUSCULAR | Status: DC
  Filled 2016-03-19: qty 20

## 2016-03-19 MED ORDER — BUPIVACAINE LIPOSOME 1.3 % IJ SUSP
INTRAMUSCULAR | Status: DC | PRN
  Administered 2016-03-19: 20 mL

## 2016-03-19 MED ORDER — LACTATED RINGERS IV SOLN
INTRAVENOUS | Status: DC | PRN
  Administered 2016-03-19 (×3): via INTRAVENOUS

## 2016-03-19 MED ORDER — FENTANYL CITRATE (PF) 250 MCG/5ML IJ SOLN
INTRAMUSCULAR | Status: AC
  Filled 2016-03-19: qty 5

## 2016-03-19 MED ORDER — SODIUM CHLORIDE 0.9 % IJ SOLN
INTRAMUSCULAR | Status: AC
  Filled 2016-03-19: qty 10

## 2016-03-19 SURGICAL SUPPLY — 69 items
APPLICATOR COTTON TIP 6IN STRL (MISCELLANEOUS) IMPLANT
APPLIER CLIP 5 13 M/L LIGAMAX5 (MISCELLANEOUS)
APPLIER CLIP ROT 10 11.4 M/L (STAPLE)
APPLIER CLIP ROT 13.4 12 LRG (CLIP)
BLADE SURG 15 STRL LF DISP TIS (BLADE) ×3 IMPLANT
BLADE SURG 15 STRL SS (BLADE) ×1
CABLE HIGH FREQUENCY MONO STRZ (ELECTRODE) ×4 IMPLANT
CLIP APPLIE 5 13 M/L LIGAMAX5 (MISCELLANEOUS) IMPLANT
CLIP APPLIE ROT 10 11.4 M/L (STAPLE) IMPLANT
CLIP APPLIE ROT 13.4 12 LRG (CLIP) IMPLANT
COVER SURGICAL LIGHT HANDLE (MISCELLANEOUS) IMPLANT
DERMABOND ADVANCED (GAUZE/BANDAGES/DRESSINGS) ×1
DERMABOND ADVANCED .7 DNX12 (GAUZE/BANDAGES/DRESSINGS) ×3 IMPLANT
DEVICE SUT QUICK LOAD TK 5 (STAPLE) IMPLANT
DEVICE SUT TI-KNOT TK 5X26 (MISCELLANEOUS) IMPLANT
DEVICE SUTURE ENDOST 10MM (ENDOMECHANICALS) IMPLANT
DEVICE TROCAR PUNCTURE CLOSURE (ENDOMECHANICALS) ×4 IMPLANT
DISSECTOR BLUNT TIP ENDO 5MM (MISCELLANEOUS) IMPLANT
ELECT REM PT RETURN 9FT ADLT (ELECTROSURGICAL) ×4
ELECTRODE REM PT RTRN 9FT ADLT (ELECTROSURGICAL) ×3 IMPLANT
GAUZE SPONGE 4X4 12PLY STRL (GAUZE/BANDAGES/DRESSINGS) IMPLANT
GLOVE BIOGEL M 8.0 STRL (GLOVE) IMPLANT
GLOVE SURG SS PI 6.5 STRL IVOR (GLOVE) ×4 IMPLANT
GLOVE SURG SS PI 7.0 STRL IVOR (GLOVE) ×8 IMPLANT
GLOVE SURG SS PI 7.5 STRL IVOR (GLOVE) ×4 IMPLANT
GLOVE SURG SS PI 8.0 STRL IVOR (GLOVE) ×4 IMPLANT
GOWN STRL REUS W/TWL XL LVL3 (GOWN DISPOSABLE) ×16 IMPLANT
HANDLE STAPLE EGIA 4 XL (STAPLE) ×4 IMPLANT
HOLDER FOLEY CATH W/STRAP (MISCELLANEOUS) ×4 IMPLANT
HOVERMATT SINGLE USE (MISCELLANEOUS) ×4 IMPLANT
IRRIG SUCT STRYKERFLOW 2 WTIP (MISCELLANEOUS)
IRRIGATION SUCT STRKRFLW 2 WTP (MISCELLANEOUS) IMPLANT
KIT BASIN OR (CUSTOM PROCEDURE TRAY) ×4 IMPLANT
MARKER SKIN DUAL TIP RULER LAB (MISCELLANEOUS) ×4 IMPLANT
NEEDLE SPNL 22GX3.5 QUINCKE BK (NEEDLE) ×4 IMPLANT
PACK UNIVERSAL I (CUSTOM PROCEDURE TRAY) ×4 IMPLANT
POUCH SPECIMEN RETRIEVAL 10MM (ENDOMECHANICALS) ×4 IMPLANT
RELOAD TRI 45 ART MED THCK BLK (STAPLE) ×4 IMPLANT
RELOAD TRI 45 ART MED THCK PUR (STAPLE) IMPLANT
RELOAD TRI 60 ART MED THCK BLK (STAPLE) ×8 IMPLANT
RELOAD TRI 60 ART MED THCK PUR (STAPLE) ×8 IMPLANT
SCISSORS LAP 5X45 EPIX DISP (ENDOMECHANICALS) ×4 IMPLANT
SEALANT SURGICAL APPL DUAL CAN (MISCELLANEOUS) IMPLANT
SET IRRIG TUBING LAPAROSCOPIC (IRRIGATION / IRRIGATOR) ×4 IMPLANT
SHEARS HARMONIC ACE PLUS 45CM (MISCELLANEOUS) ×4 IMPLANT
SLEEVE ADV FIXATION 5X100MM (TROCAR) ×12 IMPLANT
SLEEVE GASTRECTOMY 36FR VISIGI (MISCELLANEOUS) ×4 IMPLANT
SOLUTION ANTI FOG 6CC (MISCELLANEOUS) ×4 IMPLANT
SPONGE LAP 18X18 X RAY DECT (DISPOSABLE) ×4 IMPLANT
STAPLER VISISTAT 35W (STAPLE) ×4 IMPLANT
SUT SURGIDAC NAB ES-9 0 48 120 (SUTURE) IMPLANT
SUT VIC AB 4-0 SH 18 (SUTURE) IMPLANT
SUT VICRYL 0 TIES 12 18 (SUTURE) ×4 IMPLANT
SYR 10ML ECCENTRIC (SYRINGE) ×4 IMPLANT
SYR 20CC LL (SYRINGE) ×4 IMPLANT
SYR 50ML LL SCALE MARK (SYRINGE) ×4 IMPLANT
SYSTEM WECK SHIELD CLOSURE (TROCAR) IMPLANT
TOWEL OR 17X26 10 PK STRL BLUE (TOWEL DISPOSABLE) ×8 IMPLANT
TOWEL OR NON WOVEN STRL DISP B (DISPOSABLE) ×4 IMPLANT
TRAY FOLEY CATH SILVER 16FR LF (SET/KITS/TRAYS/PACK) ×4 IMPLANT
TRAY FOLEY W/METER SILVER 16FR (SET/KITS/TRAYS/PACK) IMPLANT
TROCAR ADV FIXATION 12X100MM (TROCAR) ×4 IMPLANT
TROCAR ADV FIXATION 5X100MM (TROCAR) ×4 IMPLANT
TROCAR BLADELESS 15MM (ENDOMECHANICALS) ×4 IMPLANT
TROCAR BLADELESS OPT 5 100 (ENDOMECHANICALS) ×4 IMPLANT
TUBE CALIBRATION LAPBAND (TUBING) IMPLANT
TUBING CONNECTING 10 (TUBING) ×4 IMPLANT
TUBING ENDO SMARTCAP (MISCELLANEOUS) ×4 IMPLANT
TUBING INSUF HEATED (TUBING) ×4 IMPLANT

## 2016-03-19 NOTE — Discharge Instructions (Addendum)
[facility] Discharge Instructions  °On the day of surgery you were given the medication aprepitant. This medication interacts with hormonal forms of birth control (oral contraceptives and injected or implanted birth control) and may make them ineffective. °IF YOU USE ANY HORMONAL FORM OF BIRTH CONTROL, YOU MUST USE AN ADDITIONAL BARRIER BIRTH CONTROL METHOD FOR ONE MONTH after receiving aprepitant or there is a chance you could become pregnant. ° ° ° ° °GASTRIC BYPASS/SLEEVE ° Home Care Instructions ° ° These instructions are to help you care for yourself when you go home. ° °Call: If you have any problems. °• Call [phone] and ask for the surgeon on call °• If you need immediate assistance come to the ER at [facility]. Tell the ER staff you are a new post-op gastric bypass or gastric sleeve patient  °Signs and symptoms to report: • Severe  vomiting or nausea °o If you cannot handle clear liquids for longer than 1 day, call your surgeon °• Abdominal pain which does not get better after taking your pain medication °• Fever greater than 100.4°  F and chills °• Heart rate over 100 beats a minute °• Trouble breathing °• Chest pain °• Redness,  swelling, drainage, or foul odor at incision (surgical) sites °• If your incisions open or pull apart °• Swelling or pain in calf (lower leg) °• Diarrhea (Loose bowel movements that happen often), frequent watery, uncontrolled bowel movements °• Constipation, (no bowel movements for 3 days) if this happens: °o Take Milk of Magnesia, 2 tablespoons by mouth, 3 times a day for 2 days if needed °o Stop taking Milk of Magnesia once you have had a bowel movement °o Call your doctor if constipation continues °Or °o Take Miralax  (instead of Milk of Magnesia) following the label instructions °o Stop taking Miralax once you have had a bowel movement °o Call your doctor if constipation continues °• Anything you think is “abnormal for you” °  °Normal side effects after surgery: • Unable  to sleep at night or unable to concentrate °• Irritability °• Being tearful (crying) or depressed ° °These are common complaints, possibly related to your anesthesia, stress of surgery, and change in lifestyle, that usually go away a few weeks after surgery. If these feelings continue, call your medical doctor.  °Wound Care: You may have surgical glue, steri-strips, or staples over your incisions after surgery °• Surgical glue: Looks like clear film over your incisions and will wear off a little at a time °• Steri-strips: Adhesive strips of tape over your incisions. You may notice a yellowish color on skin under the steri-strips. This is used to make the steri-strips stick better. Do not pull the steri-strips off - let them fall off °• Staples: Staples may be removed before you leave the hospital °o If you go home with staples, call [facility] for an appointment with your surgeon’s nurse to have staples removed 10 days after surgery, [phone] °• Showering: You may shower two (2) days after your surgery unless your surgeon tells you differently °o Wash gently around incisions with warm soapy water, rinse well, and gently pat dry °o If you have a drain (tube from your incision), you may need someone to hold this while you shower °o No tub baths until staples are removed and incisions are healed °  °Medications: • Medications should be liquid or crushed if larger than the size of a dime °• Extended release pills (medication that releases a little bit at   a time through the  day) should not be crushed °• Depending on the size and number of medications you take, you may need to space (take a few throughout the day)/change the time you take your medications so that you do not over-fill your pouch (smaller stomach) °• Make sure you follow-up with you primary care physician to make medication changes needed during rapid weight loss and life -style changes °• If you have diabetes, follow up with your  doctor that orders your diabetes medication(s) within one week after surgery and check your blood sugar regularly ° °• Do not drive while taking narcotics (pain medications) ° °• Do not take acetaminophen (Tylenol) and Roxicet or Lortab Elixir at the same time since these pain medications contain acetaminophen °  °Diet:  °First 2 Weeks You will see the nutritionist about two (2) weeks after your surgery. The nutritionist will increase the types of foods you can eat if you are handling liquids well: °• If you have severe vomiting or nausea and cannot handle clear liquids lasting longer than 1 day call your surgeon °Protein Shake °• Drink at least 2 ounces of shake 5-6 times per day °• Each serving of protein shakes (usually 8-12 ounces) should have a minimum of: °o 15 grams of protein °o And no more than 5 grams of carbohydrate °• Goal for protein each day: °o Men = 80 grams per day °o Women = 60 grams per day °  ° • Protein powder may be added to fluids such as non-fat milk or Lactaid milk or Soy milk (limit to 35 grams added protein powder per serving) ° °Hydration °• Slowly increase the amount of water and other clear liquids as tolerated (See Acceptable Fluids) °• Slowly increase the amount of protein shake as tolerated °• Sip fluids slowly and throughout the day °• May use sugar substitutes in small amounts (no more than 6-8 packets per day; i.e. Splenda) ° °Fluid Goal °• The first goal is to drink at least 8 ounces of protein shake/drink per day (or as directed by the nutritionist); some examples of protein shakes are Syntrax Nectar, Adkins Advantage, EAS Edge HP, and Unjury. - See handout from pre-op Bariatric Education Class: °o Slowly increase the amount of protein shake you drink as tolerated °o You may find it easier to slowly sip shakes throughout the day °o It is important to get your proteins in first °• Your fluid goal is to drink 64-100 ounces of fluid daily °o It may take a few weeks to build up to  this  °• 32 oz. (or more) should be clear liquids °And °• 32 oz. (or more) should be full liquids (see below for examples) °• Liquids should not contain sugar, caffeine, or carbonation ° °Clear Liquids: °• Water of Sugar-free flavored water (i.e. Fruit H²O, Propel) °• Decaffeinated coffee or tea (sugar-free) °• Crystal lite, Wyler’s Lite, Minute Maid Lite °• Sugar-free Jell-O °• Bouillon or broth °• Sugar-free Popsicle:    - Less than 20 calories each; Limit 1 per day ° °Full Liquids: °                  Protein Shakes/Drinks + 2 choices per day of other full liquids °• Full liquids must be: °o No More Than 12 grams of Carbs per serving °o No More Than 3 grams of Fat per serving °• Strained low-fat cream soup °• Non-Fat milk °• Fat-free Lactaid Milk °• Sugar-free yogurt (Dannon Lite & Fit, Greek   yogurt) ° °  °Vitamins and Minerals • Start 1 day after surgery unless otherwise directed by your surgeon °• 2 Chewable Multivitamin / Multimineral Supplement with iron (i.e. Centrum for Adults) °• Vitamin B-12, 350-500 micrograms sub-lingual (place tablet under the tongue) each day °• Chewable Calcium Citrate with Vitamin D-3 °(Example: 3 Chewable Calcium  Plus 600 with Vitamin D-3) °o Take 500 mg three (3) times a day for a total of 1500 mg each day °o Do not take all 3 doses of calcium at one time as it may cause constipation, and you can only absorb 500 mg at a time °o Do not mix multivitamins containing iron with calcium supplements;  take 2 hours apart °o Do not substitute Tums (calcium carbonate) for your calcium °• Menstruating women and those at risk for anemia ( a blood disease that causes weakness) may need extra iron °o Talk to your doctor to see if you need more iron °• If you need extra iron: Total daily Iron recommendation (including Vitamins) is 50 to 100 mg Iron/day °• Do not stop taking or change any vitamins or minerals until you talk to your nutritionist or surgeon °• Your nutritionist and/or surgeon must  approve all vitamin and mineral supplements °  °Activity and Exercise: It is important to continue walking at home. Limit your physical activity as instructed by your doctor. During this time, use these guidelines: °• Do not lift anything greater than ten  (10) pounds for at least two (2) weeks °• Do not go back to work or drive until your surgeon says you can °• You may have sex when you feel comfortable °o It is VERY important for female patients to use a reliable birth control method; fertility often increase after surgery °o Do not get pregnant for at least 18 months °• Start exercising as soon as your doctor tells you that you can °o Make sure your doctor approves any physical activity °• Start with a simple walking program °• Walk 5-15 minutes each day, 7 days per week °• Slowly increase until you are walking 30-45 minutes per day °• Consider joining our BELT program. [phone] or email [email or web address] °  °Special Instructions Things to remember: °• Free counseling is available for you and your family through collaboration between [facility] and [employer]. Please call [phone] and leave a message °• Use your CPAP when sleeping if this applies to you °• Consider buying a medical alert bracelet that says you had lap-band surgery °  °  You will likely have your first fill (fluid added to your band) 6 - 8 weeks after surgery °• [facility] has a free Bariatric Surgery Support Group that meets monthly, the 3rd Thursday, 6pm. [facility] Class[facility]. You can see classes online at [email or web address] °• It is very important to keep all follow up appointments with your surgeon, nutritionist, primary care physician, and behavioral health practitioner °o After the first year, please follow up with your bariatric surgeon and nutritionist at least once a year in order to maintain best weight loss results °      °             [facility]:  [phone] ° °             [facility]: [phone] ° °             Bariatric Nurse Coordinator: [phone]  °Gastric Bypass/Sleeve Home Care   Instructions  Rev. 05/2012    ° °                                                    Reviewed and Endorsed °                                                   by [facility] Patient Education Committee, Jan, 2014 ° ° ° ° ° ° ° ° ° °

## 2016-03-19 NOTE — Anesthesia Procedure Notes (Signed)
Procedure Name: Intubation Date/Time: 03/19/2016 7:40 AM Performed by: Lissa Morales Pre-anesthesia Checklist: Patient identified, Emergency Drugs available, Suction available and Patient being monitored Patient Re-evaluated:Patient Re-evaluated prior to inductionOxygen Delivery Method: Circle system utilized Preoxygenation: Pre-oxygenation with 100% oxygen Intubation Type: IV induction Ventilation: Mask ventilation without difficulty and Oral airway inserted - appropriate to patient size Laryngoscope Size: Glidescope and 4 Grade View: Grade III Tube type: Oral Tube size: 7.5 mm Number of attempts: 1 Airway Equipment and Method: Stylet and Oral airway Placement Confirmation: ETT inserted through vocal cords under direct vision,  positive ETCO2 and breath sounds checked- equal and bilateral Secured at: 22 cm Tube secured with: Tape Dental Injury: Teeth and Oropharynx as per pre-operative assessment  Difficulty Due To: Difficult Airway- due to anterior larynx and Difficult Airway- due to limited oral opening Comments: Elective glidescope secondary to  MAP3

## 2016-03-19 NOTE — Op Note (Signed)
Name:  Beverly Washington MRN: [id number] Date of Surgery: 03/19/2016  Preop Diagnosis:  Morbid Obesity  Postop Diagnosis:  Morbid Obesity (Weight - 230, BMI - 40.8), S/P Gastric Sleeve  Procedure:  Upper endoscopy  (Intraoperative)  Surgeon:  Alphonsa Overall, M.D.  Anesthesia:  GET  Indications for procedure: [name] is a 50 y.o. female whose primary care physician is MASOUD, JAVED, MD and has completed a Gastric Sleeve today by Dr. Hassell Done.  I am doing an intraoperative upper endoscopy to evaluate the gastric pouch.  Operative Note: The patient is under general anesthesia.  Dr. Hassell Done is laparoscoping the patient while I do an upper endoscopy to evaluate the stomach pouch.  With the patient intubated, I passed the Olympus upper endoscope without difficulty down the esophagus.  The esophagus was unremarkable.  The esophago-gastric junction was at 39 cm.    The mucosa of the stomach looked viable and the staple line was intact without bleeding.  I advanced the scope to the pylorus, but did not go through it.  While I insufflated the stomach pouch with air, Dr. Hassell Done  flooded the upper abdomen with saline to put the gastric pouch under saline.  There was no bubbling or evidence of a leak.  There was no evidence of narrowing of the pouch and the gastric sleeve looked tubular.  The scope was then withdrawn.  The esophagus was unremarkable and the patient tolerated the endoscopy without difficulty.  Alphonsa Overall, MD, [facility] Pager: [phone] Office phone:  [phone]

## 2016-03-19 NOTE — Interval H&P Note (Signed)
History and Physical Interval Note:  03/19/2016 7:31 AM  Beverly Washington  has presented today for surgery, with the diagnosis of Morbid Obesity, Diabetes, HTN, H/O ASCVD, Hypercholesterolemia  The various methods of treatment have been discussed with the patient and family. After consideration of risks, benefits and other options for treatment, the patient has consented to  Procedure(s): LAPAROSCOPIC GASTRIC SLEEVE RESECTION, UPPER ENDO (N/A) as a surgical intervention .  The patient's history has been reviewed, patient examined, no change in status, stable for surgery.  I have reviewed the patient's chart and labs.  Questions were answered to the patient's satisfaction.     Bronislaw Switzer B

## 2016-03-19 NOTE — Transfer of Care (Signed)
Immediate Anesthesia Transfer of Care Note  Patient: Beverly Washington  Procedure(s) Performed: Procedure(s): LAPAROSCOPIC  LYSIS OF ADHESIONS, GASTRIC SLEEVE RESECTION, UPPER ENDO (N/A) LAPAROSCOPIC LYSIS OF ADHESIONS UPPER GI ENDOSCOPY  Patient Location: PACU  Anesthesia Type:General  Level of Consciousness: awake, alert , oriented and patient cooperative  Airway & Oxygen Therapy: Patient Spontanous Breathing and Patient connected to face mask oxygen  Post-op Assessment: Report given to RN, Post -op Vital signs reviewed and stable and Patient moving all extremities X 4  Post vital signs: stable  Last Vitals:  Vitals:   03/19/16 0550  BP: (!) 148/86  Pulse: 81  Resp: 18  Temp: 36.4 C    Last Pain:  Vitals:   03/19/16 0550  TempSrc: Oral      Patients Stated Pain Goal: 4 [id number] [phone]  Complications: No apparent anesthesia complications

## 2016-03-19 NOTE — Anesthesia Postprocedure Evaluation (Signed)
Anesthesia Post Note  Patient: Beverly Washington  Procedure(s) Performed: Procedure(s) (LRB): LAPAROSCOPIC  LYSIS OF ADHESIONS, GASTRIC SLEEVE RESECTION, UPPER ENDO (N/A) LAPAROSCOPIC LYSIS OF ADHESIONS UPPER GI ENDOSCOPY  Patient location during evaluation: PACU Anesthesia Type: General Level of consciousness: awake and alert Pain management: pain level controlled Vital Signs Assessment: post-procedure vital signs reviewed and stable Respiratory status: spontaneous breathing, nonlabored ventilation, respiratory function stable and patient connected to nasal cannula oxygen Cardiovascular status: blood pressure returned to baseline and stable Postop Assessment: no signs of nausea or vomiting Anesthetic complications: no    Last Vitals:  Vitals:   03/19/16 1115 03/19/16 1130  BP: 138/77 (!) 142/81  Pulse: 72 73  Resp: 12 13  Temp:  36.6 C    Last Pain:  Vitals:   03/19/16 1130  TempSrc:   PainSc: Asleep                 Ysabela Keisler S

## 2016-03-19 NOTE — Op Note (Signed)
Surgeon: Kaylyn Lim, MD, FACS  Asst:  Alphonsa Overall, MD, FACS  Anes:  General endotracheal  Procedure: Laparoscopic enterolysis, Laparoscopic sleeve gastrectomy and upper endoscopy  Diagnosis: Morbid obesity  Complications: none  EBL:   20 cc  Description of Procedure:  The patient was take to OR 1 and given general anesthesia.  The abdomen was prepped with PCMX and draped sterilely.  A timeout was performed.  Access to the abdomen was achieved with a 5 mm Optiview through the left upper quadrant.  She had a prior component separation and her abdomen was socked in with horrible adhesions.  I placed a second 5 mm in the upper abdomen which was subsequently used for the liver retractor.  With that trocar I began to take down adhesions in the upper abdomen and create a space for a sleeve gastrectomy.  There was no way to free the small bowel for a roux en Y gastric bypass.   I was able to safely create space for a sleeve gastrectomy.  Seven ports were needed including one 15 mm trocar for the stapler.   The ViSiGi 36Fr tube was inserted to deflate the stomach and was pulled back into the esophagus.    The pylorus was identified and we measured 5 cm back and marked the antrum.  At that point we began dissection to take down the greater curvature of the stomach using the Harmonic scalpel.  This dissection was taken all the way up to the left crus.  Posterior attachments of the stomach were also taken down.  No dimple or evidence of a hiatal hernia was seen.    The ViSiGi tube was then passed into the antrum and suction applied so that it was snug along the lessor curvature.  The "crow's foot" or incisura was identified.  The sleeve gastrectomy was begun using the [employer] stapler beginning with a 4.5 cm black with TRS followed by two 6 cm black loads with TRS and then purple with TRS.  When the sleeve was complete the tube was taken off suction and insufflated briefly.  The tube was  withdrawn.  Upper endoscopy was then performed by Dr. Lucia Gaskins.  A tubular sleeve was found with no bubbles or bleeding.     The specimen was extracted through the 15 trocar site.  Wounds were infiltrated with Exparel and closed with 4-0 Monocryl.  The fifteen port was closed with a zero Vicryl and the endoclose.    Matt B. Hassell Done, [place], [facility], [place]

## 2016-03-19 NOTE — H&P (Signed)
Chief Complaint:  Morbid obesity  History of Present Illness:  Beverly Washington is an 50 y.o. female with whom we have had discussions about roux e Y gastric bypass and sleeve gastrectomy.  She has had a component separation procedure for a large ventral hernia and bypass may not be possible.  Will proceed with sleeve gastrectomy in that event.  Also, I have discussed open sleeve gastrectomy if necessary .    Past Medical History:  Diagnosis Date  . Complication of anesthesia 2014   low bp with epidural  . Coronary artery disease    Cardiac catheterization in February 2016 showed an 80% discrete mid LAD stenosis at the origin of first diagonal branch. The LAD was very small after the diagonal branch less than 2.5 mm. Ejection fraction was 65% with mildly elevated left ventricular end-diastolic pressure.  . Hyperlipidemia   . Hypertension   . Iritis    right eye  . Pre-diabetes     Past Surgical History:  Procedure Laterality Date  . abdominal plasty    . BREAST REDUCTION SURGERY    . CARDIAC CATHETERIZATION  05/07/2014   [facility]  . CARDIAC CATHETERIZATION Left 08/05/2015   Procedure: Left Heart Cath and Coronary Angiography;  Surgeon: Wellington Hampshire, MD;  Location: [facility];  Service: Cardiovascular;  Laterality: Left;  . CESAREAN SECTION     x3  . CHOLECYSTECTOMY    . EYE MUSCLE SURGERY    . HERNIA REPAIR    . OOPHORECTOMY     right  . REDUCTION MAMMAPLASTY Bilateral 2000  . TOTAL ABDOMINAL HYSTERECTOMY      Current Facility-Administered Medications  Medication Dose Route Frequency Provider Last Rate Last Dose  . bupivacaine liposome (EXPAREL) 1.3 % injection 266 mg  20 mL Infiltration Once Johnathan Hausen, MD      . cefoTEtan in Dextrose 5% (CEFOTAN) IVPB 2 g  2 g Intravenous On Call to [place], MD      . chlorhexidine (HIBICLENS) 4 % liquid 4 application  60 mL Topical Once Johnathan Hausen, MD       And  . Derrill Memo ON 03/20/2016] chlorhexidine (HIBICLENS) 4  % liquid 4 application  60 mL Topical Once Johnathan Hausen, MD       Facility-Administered Medications Ordered in Other Encounters  Medication Dose Route Frequency Provider Last Rate Last Dose  . lactated ringers infusion    Continuous PRN Lissa Morales, CRNA       Ceftriaxone; Hydromorphone; Latex; and Olmesartan Family History  Problem Relation Age of Onset  . Heart disease Mother   . Heart attack Mother   . Pulmonary embolism Mother   . Breast cancer Mother 19  . Heart attack Maternal Uncle   . Heart disease Maternal Uncle   . Heart attack Paternal Uncle   . Heart attack Maternal Uncle   . Heart disease Maternal Uncle   . Heart attack Maternal Uncle   . Heart disease Maternal Uncle   . Heart attack Maternal Uncle   . Heart disease Maternal Uncle   . Heart attack Maternal Uncle   . Heart disease Maternal Uncle   . Heart attack Maternal Uncle   . Heart disease Maternal Uncle    Social History:   reports that she has never smoked. She has never used smokeless tobacco. She reports that she does not drink alcohol or use drugs.   REVIEW OF SYSTEMS : Negative except for see problem list  Physical Exam:  Blood pressure (!) 148/86, pulse 81, temperature 97.6 F (36.4 C), temperature source Oral, resp. rate 18, height 5\' 3"  (1.6 m), weight 104.5 kg (230 lb 6.4 oz), SpO2 98 %. Body mass index is 40.81 kg/m.  Gen:  WDWN WF NAD  Neurological: Alert and oriented to person, place, and time. Motor and sensory function is grossly intact  Head: Normocephalic and atraumatic.  Eyes: Conjunctivae are normal. Pupils are equal, round, and reactive to light. No scleral icterus.  Neck: Normal range of motion. Neck supple. No tracheal deviation or thyromegaly present.  Cardiovascular:  SR without murmurs or gallops.  No carotid bruits Breast:  Not examined Respiratory: Effort normal.  No respiratory distress. No chest wall tenderness. Breath sounds normal.  No wheezes, rales or rhonchi.   Abdomen:  Large midline incisions from upper to lower abdomen GU:  Not examined Musculoskeletal: Normal range of motion. Extremities are nontender. No cyanosis, edema or clubbing noted Lymphadenopathy: No cervical, preauricular, postauricular or axillary adenopathy is present Skin: Skin is warm and dry. No rash noted. No diaphoresis. No erythema. No pallor. Pscyh: Normal mood and affect. Behavior is normal. Judgment and thought content normal.   LABORATORY RESULTS: Results for orders placed or performed during the hospital encounter of 03/19/16 (from the past 48 hour(s))  Glucose, capillary     Status: Abnormal   Collection Time: 03/19/16  5:10 AM  Result Value Ref Range   Glucose-Capillary 141 (H) 65 - 99 mg/dL   Comment 1 Notify RN      RADIOLOGY RESULTS: No results found.  Problem List: Patient Active Problem List   Diagnosis Date Noted  . Unstable angina ([place])   . Coronary artery disease   . Pain in the chest 05/06/2014  . Essential hypertension 05/06/2014  . Hyperlipidemia 05/06/2014    Assessment & Plan: Morbid obesity: plan laparoscopy and determination of bypass or sleeve.     Matt B. Hassell Done, MD, [facility] [phone] beeper [phone]  03/19/2016 7:28 AM

## 2016-03-20 ENCOUNTER — Encounter (HOSPITAL_COMMUNITY): Payer: Self-pay | Admitting: Surgery

## 2016-03-20 LAB — CBC WITH DIFFERENTIAL/PLATELET
BASOS ABS: 0 10*3/uL (ref 0.0–0.1)
Basophils Relative: 0 %
Eosinophils Absolute: 0 10*3/uL (ref 0.0–0.7)
Eosinophils Relative: 0 %
HEMATOCRIT: 41.4 % (ref 36.0–46.0)
Hemoglobin: 14.2 g/dL (ref 12.0–15.0)
LYMPHS PCT: 7 %
Lymphs Abs: 1.1 10*3/uL (ref 0.7–4.0)
MCH: 31.1 pg (ref 26.0–34.0)
MCHC: 34.3 g/dL (ref 30.0–36.0)
MCV: 90.8 fL (ref 78.0–100.0)
MONO ABS: 1.2 10*3/uL — AB (ref 0.1–1.0)
Monocytes Relative: 8 %
NEUTROS ABS: 13.3 10*3/uL — AB (ref 1.7–7.7)
Neutrophils Relative %: 85 %
Platelets: 249 10*3/uL (ref 150–400)
RBC: 4.56 MIL/uL (ref 3.87–5.11)
RDW: 13.1 % (ref 11.5–15.5)
WBC: 15.6 10*3/uL — ABNORMAL HIGH (ref 4.0–10.5)

## 2016-03-20 NOTE — Progress Notes (Signed)
Patient ID: Beverly Washington, female   DOB: 01-21-1966, 50 y.o.   MRN: [id number] [facility] Progress Note:   1 Day Post-Op  Subjective: Mental status is clear.  Few complaints Objective: Vital signs in last 24 hours: Temp:  [97.7 F (36.5 C)-98.8 F (37.1 C)] 97.9 F (36.6 C) (12/19 1002) Pulse Rate:  [80-92] 81 (12/19 1002) Resp:  [16-18] 18 (12/19 1002) BP: (138-164)/(74-81) 164/78 (12/19 1002) SpO2:  [97 %-100 %] 99 % (12/19 1002) Weight:  [104.8 kg (231 lb)] 104.8 kg (231 lb) (12/19 0506)  Intake/Output from previous day: 12/18 0701 - 12/19 0700 In: 4245 [P.O.:120; I.V.:3925] Out: 3500 [Urine:3450; Blood:50] Intake/Output this shift: Total I/O In: 30 [P.O.:30] Out: 650 [Urine:650]  Physical Exam: Work of breathing is not labored;  Minimal pain  Lab Results:  Results for orders placed or performed during the hospital encounter of 03/19/16 (from the past 48 hour(s))  Glucose, capillary     Status: Abnormal   Collection Time: 03/19/16  5:10 AM  Result Value Ref Range   Glucose-Capillary 141 (H) 65 - 99 mg/dL   Comment 1 Notify RN   Hemoglobin and hematocrit, blood     Status: Abnormal   Collection Time: 03/19/16 11:19 AM  Result Value Ref Range   Hemoglobin 15.1 (H) 12.0 - 15.0 g/dL   HCT 44.1 36.0 - 46.0 %  CBC     Status: Abnormal   Collection Time: 03/19/16 12:25 PM  Result Value Ref Range   WBC 15.2 (H) 4.0 - 10.5 K/uL   RBC 4.73 3.87 - 5.11 MIL/uL   Hemoglobin 15.2 (H) 12.0 - 15.0 g/dL   HCT 42.2 36.0 - 46.0 %   MCV 89.2 78.0 - 100.0 fL   MCH 32.1 26.0 - 34.0 pg   MCHC 36.0 30.0 - 36.0 g/dL   RDW 12.8 11.5 - 15.5 %   Platelets 212 150 - 400 K/uL  Creatinine, serum     Status: None   Collection Time: 03/19/16 12:25 PM  Result Value Ref Range   Creatinine, Ser 0.74 0.44 - 1.00 mg/dL   GFR calc non Af Amer >60 >60 mL/min   GFR calc Af Amer >60 >60 mL/min    Comment: (NOTE) The eGFR has been calculated using the CKD EPI equation. This  calculation has not been validated in all clinical situations. eGFR's persistently <60 mL/min signify possible Chronic Kidney Disease.   CBC WITH DIFFERENTIAL     Status: Abnormal   Collection Time: 03/20/16  4:30 AM  Result Value Ref Range   WBC 15.6 (H) 4.0 - 10.5 K/uL   RBC 4.56 3.87 - 5.11 MIL/uL   Hemoglobin 14.2 12.0 - 15.0 g/dL   HCT 41.4 36.0 - 46.0 %   MCV 90.8 78.0 - 100.0 fL   MCH 31.1 26.0 - 34.0 pg   MCHC 34.3 30.0 - 36.0 g/dL   RDW 13.1 11.5 - 15.5 %   Platelets 249 150 - 400 K/uL   Neutrophils Relative % 85 %   Neutro Abs 13.3 (H) 1.7 - 7.7 K/uL   Lymphocytes Relative 7 %   Lymphs Abs 1.1 0.7 - 4.0 K/uL   Monocytes Relative 8 %   Monocytes Absolute 1.2 (H) 0.1 - 1.0 K/uL   Eosinophils Relative 0 %   Eosinophils Absolute 0.0 0.0 - 0.7 K/uL   Basophils Relative 0 %   Basophils Absolute 0.0 0.0 - 0.1 K/uL    Radiology/Results: No results found.  Anti-infectives: Anti-infectives  Start     Dose/Rate Route Frequency Ordered Stop   03/19/16 0830  ciprofloxacin (CIPRO) IVPB 400 mg     400 mg 200 mL/hr over 60 Minutes Intravenous  Once 03/19/16 0828 03/19/16 0726   03/19/16 0508  cefoTEtan in Dextrose 5% (CEFOTAN) IVPB 2 g  Status:  Discontinued     2 g Intravenous On call to O.R. 03/19/16 0508 03/19/16 0852      Assessment/Plan: Problem List: Patient Active Problem List   Diagnosis Date Noted  . S/P laparoscopic sleeve gastrectomy 03/19/2016  . Unstable angina ([place])   . Coronary artery disease   . Pain in the chest 05/06/2014  . Essential hypertension 05/06/2014  . Hyperlipidemia 05/06/2014    Possible discharge later today if continues to tolerate liquids and progression of diet 1 Day Post-Op    LOS: 1 day   Matt B. Hassell Done, MD, [facility] [phone] beeper [phone]  03/20/2016 12:30 PM

## 2016-03-20 NOTE — Progress Notes (Signed)
Patient alert and oriented, Post op day 1.  Provided support and encouragement.  Encouraged pulmonary toilet, ambulation and small sips of liquids.  All questions answered.  Will continue to monitor. 

## 2016-03-20 NOTE — Progress Notes (Signed)
Pt's WBC's above 14.0 this am. It is 15.4. Diet not advanced to POD 1 per physician's orders. Pt kept at 30ccs q1h. Will continue to monitor.    Beverly Washington

## 2016-03-20 NOTE — Plan of Care (Signed)
Problem: Food- and Nutrition-Related Knowledge Deficit (NB-1.1) Goal: Nutrition education Formal process to instruct or train a patient/client in a skill or to impart knowledge to help patients/clients voluntarily manage or modify food choices and eating behavior to maintain or improve health. Outcome: Completed/Met Date Met: 03/20/16 Nutrition Education Note  Received consult for diet education per DROP protocol.   Discussed 2 week post op diet with pt. Emphasized that liquids must be non carbonated, non caffeinated, and sugar free. Fluid goals discussed. Reviewed progression of diet to include soft proteins at 7-10 days post-op. Pt to follow up with outpatient bariatric RD for further diet progression after 2 weeks. Multivitamins and minerals also reviewed. Teach back method used, pt expressed understanding, expect good compliance.   Diet: First 2 Weeks  You will see the dietitian about two (2) weeks after your surgery. The dietitian will increase the types of foods you can eat if you are handling liquids well:  If you have severe vomiting or nausea and cannot handle clear liquids lasting longer than 1 day, call your surgeon  Protein Shake  Drink at least 2 ounces of shake 5-6 times per day  Each serving of protein shakes (usually 8 - 12 ounces) should have a minimum of:  15 grams of protein  And no more than 5 grams of carbohydrate  Goal for protein each day:  Men = 80 grams per day  Women = 60 grams per day  Protein powder may be added to fluids such as non-fat milk or Lactaid milk or Soy milk (limit to 35 grams added protein powder per serving)   Hydration  Slowly increase the amount of water and other clear liquids as tolerated (See Acceptable Fluids)  Slowly increase the amount of protein shake as tolerated  Sip fluids slowly and throughout the day  May use sugar substitutes in small amounts (no more than 6 - 8 packets per day; i.e. Splenda)   Fluid Goal  The first goal is to  drink at least 8 ounces of protein shake/drink per day (or as directed by the nutritionist); some examples of protein shakes are Syntrax Nectar, Adkins Advantage, EAS Edge HP, and Unjury. See handout from pre-op Bariatric Education Class:  Slowly increase the amount of protein shake you drink as tolerated  You may find it easier to slowly sip shakes throughout the day  It is important to get your proteins in first  Your fluid goal is to drink 64 - 100 ounces of fluid daily  It may take a few weeks to build up to this  32 oz (or more) should be clear liquids  And  32 oz (or more) should be full liquids (see below for examples)  Liquids should not contain sugar, caffeine, or carbonation   Clear Liquids:  Water or Sugar-free flavored water (i.e. Fruit H2O, Propel)  Decaffeinated coffee or tea (sugar-free)  Crystal Lite, Wyler's Lite, Minute Maid Lite  Sugar-free Jell-O  Bouillon or broth  Sugar-free Popsicle: *Less than 20 calories each; Limit 1 per day   Full Liquids:  Protein Shakes/Drinks + 2 choices per day of other full liquids  Full liquids must be:  No More Than 12 grams of Carbs per serving  No More Than 3 grams of Fat per serving  Strained low-fat cream soup  Non-Fat milk  Fat-free Lactaid Milk  Sugar-free yogurt (Dannon Lite & Fit, Greek yogurt)     Alvin Diffee, MS, RD, LDN Pager: [phone] After Hours Pager: [phone]    

## 2016-03-20 NOTE — Discharge Summary (Signed)
Physician Discharge Summary  Patient ID: Beverly Washington MRN: [id number] DOB/AGE: [birth year] 50 y.o.  Admit date: 03/19/2016 Discharge date: 03/20/2016  Admission Diagnoses:  Morbid obesity and prior complex abdominal wall hernia repair  Discharge Diagnoses:  same  Active Problems:   S/P laparoscopic sleeve gastrectomy   Surgery:  Enterolysis and lap sleeve gastrectomy  Discharged Condition: improved  Hospital Course:   Had surgery including enterolysis;  Did well with liquids postop and ready for discharge on PD 1  Consults: none  Significant Diagnostic Studies: none    Discharge Exam: Blood pressure (!) 152/84, pulse 72, temperature 98.7 F (37.1 C), temperature source Oral, resp. rate 18, height 5\' 3"  (1.6 m), weight 104.8 kg (231 lb), SpO2 98 %. Incisions OK  Disposition: 01-Home or Self Care  Discharge Instructions    Ambulate hourly while awake    Complete by:  As directed    Call MD for:  difficulty breathing, headache or visual disturbances    Complete by:  As directed    Call MD for:  persistant dizziness or light-headedness    Complete by:  As directed    Call MD for:  persistant nausea and vomiting    Complete by:  As directed    Call MD for:  redness, tenderness, or signs of infection (pain, swelling, redness, odor or green/yellow discharge around incision site)    Complete by:  As directed    Call MD for:  severe uncontrolled pain    Complete by:  As directed    Call MD for:  temperature >101 F    Complete by:  As directed    Diet bariatric full liquid    Complete by:  As directed    Incentive spirometry    Complete by:  As directed    Perform hourly while awake     Allergies as of 03/20/2016      Reactions   Ceftriaxone Rash   rocephin   Hydromorphone Rash   Latex Rash   swelling   Olmesartan Palpitations      Medication List    STOP taking these medications   clopidogrel 75 MG tablet Commonly known as:  PLAVIX     TAKE these  medications   amiloride-hydrochlorothiazide 5-50 MG tablet Commonly known as:  MODURETIC Take 1 tablet by mouth daily as needed (for leg edema). Notes to patient:  Monitor Blood Pressure Daily and keep a log for primary care physician.  Monitor for symptoms of dehydration.  You may need to make changes to your medications with rapid weight loss.     Biotin 10000 MCG Tabs Take 10,000 mcg by mouth daily.   diltiazem 120 MG tablet Commonly known as:  CARDIZEM Take 120 mg by mouth daily. Notes to patient:  Monitor Blood Pressure Daily and keep a log for primary care physician.  You may need to make changes to your medications with rapid weight loss.     diltiazem 180 MG 24 hr capsule Commonly known as:  CARDIZEM CD Take 1 capsule (180 mg total) by mouth daily. Notes to patient:  Monitor Blood Pressure Daily and keep a log for primary care physician.  You may need to make changes to your medications with rapid weight loss.     DUREZOL 0.05 % Emul Generic drug:  Difluprednate Place 1 drop into the right eye 3 (three) times daily.   MULTI-VITAMINS Tabs Take 1 tablet by mouth daily.   rosuvastatin 10 MG tablet Commonly known as:  CRESTOR Take 10 mg by mouth every evening.      Follow-up Information    Pedro Earls, MD. Go on 04/13/2016.   Specialty:  General Surgery Why:  at 10:00 AM for post-op check  Contact information: [address] [place] [address] [phone]        Makynzie Dobesh B, MD Follow up.   Specialty:  General Surgery Contact information: [address] [place] [address] [phone]           Signed: Pedro Earls 03/20/2016, 4:49 PM

## 2016-03-30 DIAGNOSIS — H44131 Sympathetic uveitis, right eye: Secondary | ICD-10-CM | POA: Diagnosis not present

## 2016-03-30 DIAGNOSIS — H20021 Recurrent acute iridocyclitis, right eye: Secondary | ICD-10-CM | POA: Diagnosis not present

## 2016-04-03 ENCOUNTER — Telehealth (HOSPITAL_COMMUNITY): Payer: Self-pay

## 2016-04-03 ENCOUNTER — Encounter: Payer: 59 | Attending: Surgery | Admitting: Dietician

## 2016-04-03 DIAGNOSIS — Z713 Dietary counseling and surveillance: Secondary | ICD-10-CM | POA: Insufficient documentation

## 2016-04-04 NOTE — Progress Notes (Signed)
Bariatric Class:  Appt start time: 1530 end time:  1630.  2 Week Post-Operative Nutrition Class  Patient was seen on 04/03/2016 for Post-Operative Nutrition education at [facility].   Surgery date: 03/19/2016 Surgery type: sleeve gastrectomy Start weight at [facility]: 227 lbs on 01/11/2016, 231 lbs on 03/12/2016 Weight today: 218 lbs  Weight change: 13 lbs  TANITA  BODY COMP RESULTS  03/12/16 04/03/16   BMI (kg/m^2) n/a 38.6   Fat Mass (lbs)  105.0   Fat Free Mass (lbs)  113.0   Total Body Water (lbs)  81.8   The following the learning objectives were met by the patient during this course:  Identifies Phase 3A (Soft, High Proteins) Dietary Goals and will begin from 2 weeks post-operatively to 2 months post-operatively  Identifies appropriate sources of fluids and proteins   States protein recommendations and appropriate sources post-operatively  Identifies the need for appropriate texture modifications, mastication, and bite sizes when consuming solids  Identifies appropriate multivitamin and calcium sources post-operatively  Describes the need for physical activity post-operatively and will follow MD recommendations  States when to call healthcare provider regarding medication questions or post-operative complications  Handouts given during class include:  Phase 3A: Soft, High Protein Diet Handout  Follow-Up Plan: Patient will follow-up at [facility] in 6 weeks for 2 month post-op nutrition visit for diet advancement per MD.

## 2016-04-04 NOTE — Telephone Encounter (Signed)
Made discharge phone call to patient asking the following questions.    1. Do you have someone to care for you now that you are home?  Yes 2. Are you having pain now that is not relieved by your pain medication?  No  3. Are you able to drink the recommended daily amount of fluids (48 ounces minimum/day) and protein (60-80 grams/day) as prescribed by the dietitian or nutritional counselor?  Yes 4. Are you taking the vitamins and minerals as prescribed?  Yes 5. Do you have the "on call" number to contact your surgeon if you have a problem or question?  Yes 6. Are your incisions free of redness, swelling or drainage? (If steri strips, address that these can fall off, shower as tolerated) Yes 7. Have your bowels moved since your surgery?  If not, are you passing gas?  Yes 8. Are you up and walking 3-4 times per day?  Yes 9. Were you provided your discharge medications before your surgery or before you were discharged from the hospital and are you taking them without problem?  Yes  Gautham Hewins RN

## 2016-05-16 ENCOUNTER — Encounter: Payer: Self-pay | Admitting: Skilled Nursing Facility1

## 2016-05-16 ENCOUNTER — Encounter: Payer: 59 | Attending: Surgery | Admitting: Skilled Nursing Facility1

## 2016-05-16 DIAGNOSIS — Z713 Dietary counseling and surveillance: Secondary | ICD-10-CM | POA: Diagnosis not present

## 2016-05-16 DIAGNOSIS — E6609 Other obesity due to excess calories: Secondary | ICD-10-CM

## 2016-05-16 NOTE — Patient Instructions (Signed)
-  Protein Powder: 15 grams or more of protein and 5 grams or less of carbohydrates  Only mix your powder with: fat free cows milk OR unsweet almond milk OR unsweet soy milk  -Chew your food well  -Keep up with the fluid

## 2016-05-16 NOTE — Progress Notes (Signed)
Surgery date: 03/19/2016 Surgery type: sleeve gastrectomy Start weight at [facility]: 227 lbs on 01/11/2016, 231 lbs on 03/12/2016 Weight today: 206.8 lbs  Weight change: 11.2 lbs  TANITA  BODY COMP RESULTS  03/12/16 04/03/16 05/16/2016   BMI (kg/m^2) n/a 38.6 36.6   Fat Mass (lbs)  105.0 95.4   Fat Free Mass (lbs)  113.0 111.4   Total Body Water (lbs)  81.8 80.2    Follow-up visit:  6 Weeks Post-Operative Sleeve Gastrectomy Surgery  Medical Nutrition Therapy:  Appt start time: 2:00 end time: 2:30  Primary concerns today: Post-operative Bariatric Surgery Nutrition Management. 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   Preferred Learning Style:  No preference indicated   Learning Readiness:  Change in progress.  24-hr recall: B (AM): 1 egg 1 piece of [place] bacon (14 grams protein) Snk (AM): protein shake (30 grams protein) L (PM): chicken or fish (2-3 ounces) 21 grams  Snk (PM): half a greek yogurt (6 grams protein) D (PM): 2-3 ounces chicken or fish (21 grams) Snk (PM): sf popsicle   Fluid intake: 70 ounces of water, protein shake Estimated total protein intake: 92 grams   Medications: See List  Supplementation: Calcium and multi   Using straws: NO Drinking while eating: NO Hair loss: NO Carbonated beverages: NO N/V/D/C: NO, NO. NO, NO  Recent physical activity:  Crunches and walking on the treadmill and wts 4 days a week for 90 minutes   Progress Towards Goal(s):  In progress.  Handouts given during visit include:  NS veggies + protein   Nutritional Diagnosis:  -3.3 Overweight/obesity related to past poor dietary habits and physical inactivity as evidenced by patient w/ recent sleeve gastrectomy surgery following dietary guidelines for continued weight loss.  Intervention:  Nutrition counseling. Dietitian educated the pt on advanced her diet to include non-starchy vegetables Goals: -Protein Powder: 15 grams or more of protein and 5 grams or less of  carbohydrates  Only mix your powder with: fat free cows milk OR unsweet almond milk OR unsweet soy milk -Chew your food well -Keep up with the fluid  Teaching Method Utilized: Visual Auditory Hands on  Barriers to learning/adherence to lifestyle change: none identified   Demonstrated degree of understanding via:  Teach Back   Monitoring/Evaluation:  Dietary intake, exercise, lap band fills, and body weight.

## 2016-06-20 DIAGNOSIS — H35351 Cystoid macular degeneration, right eye: Secondary | ICD-10-CM | POA: Diagnosis not present

## 2016-06-20 DIAGNOSIS — H30891 Other chorioretinal inflammations, right eye: Secondary | ICD-10-CM | POA: Diagnosis not present

## 2016-06-20 DIAGNOSIS — H2513 Age-related nuclear cataract, bilateral: Secondary | ICD-10-CM | POA: Diagnosis not present

## 2016-07-25 DIAGNOSIS — H35351 Cystoid macular degeneration, right eye: Secondary | ICD-10-CM | POA: Diagnosis not present

## 2016-07-25 DIAGNOSIS — H30891 Other chorioretinal inflammations, right eye: Secondary | ICD-10-CM | POA: Diagnosis not present

## 2016-08-15 DIAGNOSIS — H35351 Cystoid macular degeneration, right eye: Secondary | ICD-10-CM | POA: Diagnosis not present

## 2016-08-15 DIAGNOSIS — H26491 Other secondary cataract, right eye: Secondary | ICD-10-CM | POA: Diagnosis not present

## 2016-08-15 DIAGNOSIS — H43811 Vitreous degeneration, right eye: Secondary | ICD-10-CM | POA: Diagnosis not present

## 2016-08-22 ENCOUNTER — Ambulatory Visit: Payer: Self-pay | Admitting: Skilled Nursing Facility1

## 2016-09-04 DIAGNOSIS — H26491 Other secondary cataract, right eye: Secondary | ICD-10-CM | POA: Diagnosis not present

## 2016-09-04 DIAGNOSIS — H20021 Recurrent acute iridocyclitis, right eye: Secondary | ICD-10-CM | POA: Diagnosis not present

## 2016-09-04 DIAGNOSIS — Z961 Presence of intraocular lens: Secondary | ICD-10-CM | POA: Diagnosis not present

## 2016-09-04 DIAGNOSIS — H02401 Unspecified ptosis of right eyelid: Secondary | ICD-10-CM | POA: Diagnosis not present

## 2016-09-12 ENCOUNTER — Encounter: Payer: 59 | Attending: Surgery | Admitting: Skilled Nursing Facility1

## 2016-09-12 ENCOUNTER — Ambulatory Visit: Payer: Self-pay | Admitting: Skilled Nursing Facility1

## 2016-09-12 ENCOUNTER — Encounter: Payer: Self-pay | Admitting: Skilled Nursing Facility1

## 2016-09-12 DIAGNOSIS — Z713 Dietary counseling and surveillance: Secondary | ICD-10-CM | POA: Diagnosis not present

## 2016-09-12 DIAGNOSIS — Z6832 Body mass index (BMI) 32.0-32.9, adult: Secondary | ICD-10-CM | POA: Insufficient documentation

## 2016-09-12 DIAGNOSIS — E669 Obesity, unspecified: Secondary | ICD-10-CM

## 2016-09-12 NOTE — Patient Instructions (Addendum)
-  Use fruit for one of your snacks  -Try kefir or kefir cup in the yogurt or milk aisle   -Baritastic app to set reminders for fluid

## 2016-09-12 NOTE — Progress Notes (Signed)
Surgery date: 03/19/2016 Surgery type: sleeve gastrectomy Start weight at [facility]: 227 lbs on 01/11/2016, 231 lbs on 03/12/2016 Weight today: 182 lbs  Weight change: 24.8 lbs  TANITA  BODY COMP RESULTS  03/12/16 04/03/16 05/16/2016 09/12/2016   BMI (kg/m^2) n/a 38.6 36.6 32.2   Fat Mass (lbs)  105.0 95.4 72   Fat Free Mass (lbs)  113.0 111.4 110   Total Body Water (lbs)  81.8 80.2 78.2    Follow-up visit:  6 Weeks Post-Operative Sleeve Gastrectomy Surgery  Medical Nutrition Therapy:  Appt start time: 2:00 end time: 2:30  Primary concerns today: Post-operative Bariatric Surgery Nutrition Management. 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    Pt states This is hard. Tried bread it hurt. Pt states she is  worried and feeling guilty when it comes to making food choices. Pt states she tried broccoli from cracker barrel which caused vomiting but had no issue with broccoli made at home.   Preferred Learning Style:  No preference indicated   Learning Readiness:  Change in progress.  24-hr recall: B (AM): 1 egg 1 piece of [place] bacon (14 grams protein) Snk (AM): half greek yogurt (6g) L (PM): chicken with vegetable or fish (2-3 ounces) 21 grams  Snk (PM): half a protein shake (15g) D (PM): 2-3 ounces chicken with vegetable or fish (21 grams) Snk (PM): sf popsicle   Fluid intake: 36-48 ounces of water, protein shake Estimated total protein intake: 92 grams   Medications: See List  Supplementation: Calcium and multi   Using straws: NO Drinking while eating: NO Hair loss: NO Carbonated beverages: NO N/V/D/C: NO, NO. NO, NO Having you been chewing well: yes Chewing/swallowing difficulties: no Changes in vision: no Changes to mood/headaches: no Hair loss/Cahnges to skin/Changes to nails: Thinning Any difficulty focusing or concentrating: no Sweating: no Dizziness/Lightheaded: no Palpitations: no  Carbonated beverages: no  Abdominal Pain: no  Recent physical  activity:  Crunches and walking on the treadmill and wts 4 days a week for 90 minutes   Progress Towards Goal(s):  In progress.  Handouts given during visit include:  NS veggies + protein   Nutritional Diagnosis:  [place]-3.3 Overweight/obesity related to past poor dietary habits and physical inactivity as evidenced by patient w/ recent sleeve gastrectomy surgery following dietary guidelines for continued weight loss.  Intervention:  Nutrition counseling. Dietitian educated the pt on advanced her diet to include non-starchy vegetables Goals: -Use fruit for one of your snacks -Try kefir or kefir cup in the yogurt or milk aisle  -Baritastic app to set reminders for fluid   Teaching Method Utilized: Visual Auditory Hands on  Barriers to learning/adherence to lifestyle change: none identified   Demonstrated degree of understanding via:  Teach Back   Monitoring/Evaluation:  Dietary intake, exercise, and body weight.

## 2016-09-25 ENCOUNTER — Ambulatory Visit (INDEPENDENT_AMBULATORY_CARE_PROVIDER_SITE_OTHER): Payer: 59 | Admitting: Obstetrics and Gynecology

## 2016-09-25 ENCOUNTER — Encounter: Payer: Self-pay | Admitting: Obstetrics and Gynecology

## 2016-09-25 VITALS — BP 124/76 | HR 61 | Ht 63.0 in | Wt 179.0 lb

## 2016-09-25 DIAGNOSIS — A599 Trichomoniasis, unspecified: Secondary | ICD-10-CM

## 2016-09-25 DIAGNOSIS — N898 Other specified noninflammatory disorders of vagina: Secondary | ICD-10-CM

## 2016-09-25 DIAGNOSIS — Z113 Encounter for screening for infections with a predominantly sexual mode of transmission: Secondary | ICD-10-CM

## 2016-09-25 DIAGNOSIS — R87615 Unsatisfactory cytologic smear of cervix: Secondary | ICD-10-CM | POA: Diagnosis not present

## 2016-09-25 DIAGNOSIS — Z124 Encounter for screening for malignant neoplasm of cervix: Secondary | ICD-10-CM

## 2016-09-25 DIAGNOSIS — Z1151 Encounter for screening for human papillomavirus (HPV): Secondary | ICD-10-CM

## 2016-09-25 DIAGNOSIS — R300 Dysuria: Secondary | ICD-10-CM | POA: Diagnosis not present

## 2016-09-25 DIAGNOSIS — R875 Abnormal microbiological findings in specimens from female genital organs: Secondary | ICD-10-CM | POA: Diagnosis not present

## 2016-09-25 LAB — POCT WET PREP WITH KOH
Clue Cells Wet Prep HPF POC: NEGATIVE
KOH PREP POC: NEGATIVE
TRICHOMONAS UA: POSITIVE
YEAST WET PREP PER HPF POC: NEGATIVE

## 2016-09-25 LAB — POCT URINALYSIS DIPSTICK
BILIRUBIN UA: NEGATIVE
Glucose, UA: NEGATIVE
Ketones, UA: NEGATIVE
Nitrite, UA: NEGATIVE
PH UA: 5 (ref 5.0–8.0)
Spec Grav, UA: 1.02 (ref 1.010–1.025)

## 2016-09-25 MED ORDER — METRONIDAZOLE 500 MG PO TABS: ORAL_TABLET | ORAL | 0 refills | Status: DC

## 2016-09-25 NOTE — Progress Notes (Signed)
Chief Complaint  Patient presents with  . Vaginitis    discharge x3 weeks, vaginal irritation    HPI:      [name] is a 51 y.o. G3P3 who LMP was No LMP recorded. Patient has had a hysterectomy., presents today for NP eval of increased discharge, irritation, without odor for the past 3 wks. 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 Pt has not had any sx relief. She also checked her urine due to dysuria and had leuks and protein. No culture done. Dr. Lavera Guise put her on cipro, but pt hasn't had any relief. She had a normal CBC and CMP (done due to proteinuria). She denies any abx use prior to sx, no new soaps/detergents, no LBP, pelvic pain, fevers.   Pt was sex active with new partner about 4 wks ago. She is no longer with him. She is s/p hyst years ago. No recent pap smear.    Patient Active Problem List   Diagnosis Date Noted  . S/P laparoscopic sleeve gastrectomy 03/19/2016  . Unstable angina ([place])   . Coronary artery disease   . Pain in the chest 05/06/2014  . Essential hypertension 05/06/2014  . Hyperlipidemia 05/06/2014    Family History  Problem Relation Age of Onset  . Heart disease Mother   . Heart attack Mother   . Pulmonary embolism Mother   . Breast cancer Mother 47  . Heart attack Maternal Uncle   . Heart disease Maternal Uncle   . Heart attack Paternal Uncle   . Heart attack Maternal Uncle   . Heart disease Maternal Uncle   . Heart attack Maternal Uncle   . Heart disease Maternal Uncle   . Heart attack Maternal Uncle   . Heart disease Maternal Uncle   . Heart attack Maternal Uncle   . Heart disease Maternal Uncle   . Heart attack Maternal Uncle   . Heart disease Maternal Uncle     Social History   Social History  . Marital status: Divorced    Spouse name: N/A  . Number of children: N/A  . Years of education: N/A   Occupational History  . Not on file.   Social History Main  Topics  . Smoking status: Never Smoker  . Smokeless tobacco: Never Used  . Alcohol use No  . Drug use: No  . Sexual activity: Yes    Birth control/ protection: Surgical   Other Topics Concern  . Not on file   Social History Narrative  . No narrative on file     Current Outpatient Prescriptions:  .  Biotin 10000 MCG TABS, Take 10,000 mcg by mouth daily., Disp: , Rfl:  .  fluconazole (DIFLUCAN) 150 MG tablet, , Disp: , Rfl: 0 .  Multiple Vitamin (MULTI-VITAMINS) TABS, Take 1 tablet by mouth daily. , Disp: , Rfl:  .  metroNIDAZOLE (FLAGYL) 500 MG tablet, Take 2 tabs BID for 1 day, Disp: 4 tablet, Rfl: 0  Review of Systems  Constitutional: Negative for fever.  Gastrointestinal: Negative for blood in stool, constipation, diarrhea, nausea and vomiting.  Genitourinary: Positive for dysuria and vaginal discharge. Negative for dyspareunia, flank pain, frequency, hematuria, urgency, vaginal bleeding and vaginal pain.  Musculoskeletal: Negative for back pain.  Skin: Negative for rash.     OBJECTIVE:   Vitals:  BP 124/76 (BP Location: Left Arm, Patient Position: Sitting, Cuff Size: Normal)   Pulse 61  Ht 5\' 3"  (1.6 m)   Wt 179 lb (81.2 kg)   BMI 31.71 kg/m   Physical Exam  Constitutional: She is oriented to person, place, and time and well-developed, well-nourished, and in no distress. Vital signs are normal.  Genitourinary: Right adnexa normal and left adnexa normal. Right adnexum displays no mass and no tenderness. Left adnexum displays no mass and no tenderness. Vulva exhibits erythema and exudate. Vulva exhibits no lesion, no rash and no tenderness. Vagina exhibits no lesion. Watery  odorless  yellow and vaginal discharge found.  Genitourinary Comments: UTERUS/CX SURG ABSENT  Neurological: She is oriented to person, place, and time.  Vitals reviewed.   Results: Results for orders placed or performed in visit on 09/25/16 (from the past 24 hour(s))  POCT Wet Prep with KOH      Status: Abnormal   Collection Time: 09/25/16  3:54 PM  Result Value Ref Range   Trichomonas, UA Positive    Clue Cells Wet Prep HPF POC neg    Epithelial Wet Prep HPF POC  Few, Moderate, Many, Too numerous to count   Yeast Wet Prep HPF POC neg    Bacteria Wet Prep HPF POC  Few   RBC Wet Prep HPF POC     WBC Wet Prep HPF POC     KOH Prep POC Negative Negative  POCT Urinalysis Dipstick     Status: Abnormal   Collection Time: 09/25/16  3:54 PM  Result Value Ref Range   Color, UA yellow    Clarity, UA cloudy    Glucose, UA neg    Bilirubin, UA neg    Ketones, UA neg    Spec Grav, UA 1.020 1.010 - 1.025   Blood, UA small    pH, UA 5.0 5.0 - 8.0   Protein, UA small    Urobilinogen, UA  0.2 or 1.0 E.U./dL   Nitrite, UA neg    Leukocytes, UA Moderate (2+) (A) Negative     Assessment/Plan: Trichomoniasis - Rx flagyl. Ex partner needs testing. F/u prn.  - Plan: metroNIDAZOLE (FLAGYL) 500 MG tablet, POCT Wet Prep with KOH  Vaginal discharge - Plan: POCT Wet Prep with KOH, Other/Misc lab test  Screening for STD (sexually transmitted disease) - One Swab leukorrhea done in case we need to add on additional testing for sx. Will f/u with results. - Plan: Other/Misc lab test  Dysuria - Pos Leukuria/hematuria. Check C&S. If neg, pt to rechk urine dip at work after abx tx. May be related to trich infection.  - Plan: POCT Urinalysis Dipstick, Urine Culture  Cervical cancer screening - Plan: IGP, Aptima HPV  Screening for HPV (human papillomavirus) - Plan: IGP, Aptima HPV    Return if symptoms worsen or fail to improve.  Alicia B. Copland, PA-C 09/25/2016 3:57 PM

## 2016-09-27 LAB — URINE CULTURE: Organism ID, Bacteria: NO GROWTH

## 2016-09-28 LAB — IGP, APTIMA HPV
HPV APTIMA: NEGATIVE
PAP Smear Comment: 0

## 2016-10-04 ENCOUNTER — Encounter: Payer: Self-pay | Admitting: Certified Nurse Midwife

## 2016-10-08 ENCOUNTER — Telehealth: Payer: Self-pay | Admitting: Obstetrics and Gynecology

## 2016-10-08 NOTE — Telephone Encounter (Signed)
Pt aware of trich on STD test and pap. Sx resolved with flagyl tx. Pt aware of unsatisfactory pap, neg HPV DNA and need for repeat in 3 months. Pt to call to sched this f/u appt.

## 2016-12-19 ENCOUNTER — Ambulatory Visit: Payer: Self-pay | Admitting: Skilled Nursing Facility1

## 2016-12-21 ENCOUNTER — Other Ambulatory Visit: Payer: Self-pay | Admitting: Internal Medicine

## 2016-12-21 DIAGNOSIS — Z1231 Encounter for screening mammogram for malignant neoplasm of breast: Secondary | ICD-10-CM

## 2017-01-28 DIAGNOSIS — I119 Hypertensive heart disease without heart failure: Secondary | ICD-10-CM | POA: Diagnosis not present

## 2017-01-28 DIAGNOSIS — K432 Incisional hernia without obstruction or gangrene: Secondary | ICD-10-CM | POA: Diagnosis not present

## 2017-01-28 DIAGNOSIS — Z9884 Bariatric surgery status: Secondary | ICD-10-CM | POA: Diagnosis not present

## 2017-01-28 DIAGNOSIS — R0789 Other chest pain: Secondary | ICD-10-CM | POA: Diagnosis not present

## 2017-01-28 DIAGNOSIS — I2510 Atherosclerotic heart disease of native coronary artery without angina pectoris: Secondary | ICD-10-CM | POA: Diagnosis not present

## 2017-01-30 DIAGNOSIS — I10 Essential (primary) hypertension: Secondary | ICD-10-CM | POA: Diagnosis not present

## 2017-01-30 DIAGNOSIS — G44001 Cluster headache syndrome, unspecified, intractable: Secondary | ICD-10-CM | POA: Diagnosis not present

## 2017-02-07 ENCOUNTER — Ambulatory Visit
Admission: RE | Admit: 2017-02-07 | Discharge: 2017-02-07 | Disposition: A | Discharge status: Home / Self Care | Payer: 59 | Source: Ambulatory Visit | Attending: Internal Medicine | Admitting: Internal Medicine

## 2017-02-07 DIAGNOSIS — Z1231 Encounter for screening mammogram for malignant neoplasm of breast: Secondary | ICD-10-CM | POA: Insufficient documentation

## 2017-05-03 DIAGNOSIS — H43811 Vitreous degeneration, right eye: Secondary | ICD-10-CM | POA: Diagnosis not present

## 2017-05-03 DIAGNOSIS — H2512 Age-related nuclear cataract, left eye: Secondary | ICD-10-CM | POA: Diagnosis not present

## 2017-05-03 DIAGNOSIS — H43391 Other vitreous opacities, right eye: Secondary | ICD-10-CM | POA: Diagnosis not present

## 2017-09-13 DIAGNOSIS — Z961 Presence of intraocular lens: Secondary | ICD-10-CM | POA: Diagnosis not present

## 2017-09-13 DIAGNOSIS — I10 Essential (primary) hypertension: Secondary | ICD-10-CM | POA: Diagnosis not present

## 2017-09-13 DIAGNOSIS — H2512 Age-related nuclear cataract, left eye: Secondary | ICD-10-CM | POA: Diagnosis not present

## 2017-09-13 DIAGNOSIS — H20021 Recurrent acute iridocyclitis, right eye: Secondary | ICD-10-CM | POA: Diagnosis not present

## 2017-09-13 DIAGNOSIS — H02401 Unspecified ptosis of right eyelid: Secondary | ICD-10-CM | POA: Diagnosis not present

## 2017-12-10 IMAGING — RF DG UGI W/ KUB
11 series · 12 of 12 positions shown · non-contrast
Comparison: KUB November 14, 2015

CLINICAL DATA: Pre bariatric evaluation. History previous abdominal
wall hernia repair.

EXAM:
UPPER GI SERIES WITH KUB
TECHNIQUE: After obtaining a scout radiograph a routine upper GI series was
performed using thin and high density barium. Effervescent crystals
and a barium tablet were also administered.
FLUOROSCOPY TIME:  Fluoroscopy Time:  0 minutes, 54 seconds
Radiation Exposure Index (if provided by the fluoroscopic device):
1360.21 micro Gy per meter square
Number of Acquired Spot Images: 11.

[Series 1: t abdomen supine · 0.15mm/px · 1 of 1 slices shown]
[im 1/1]
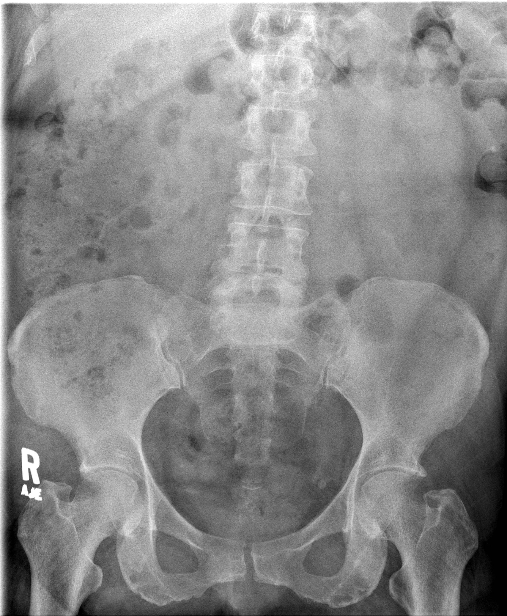

[Series 2: fluoro_barium 2fps_bw · 0.17mm/px · 2 of 2 frames shown (1 of 10)]
[frame 1/2]
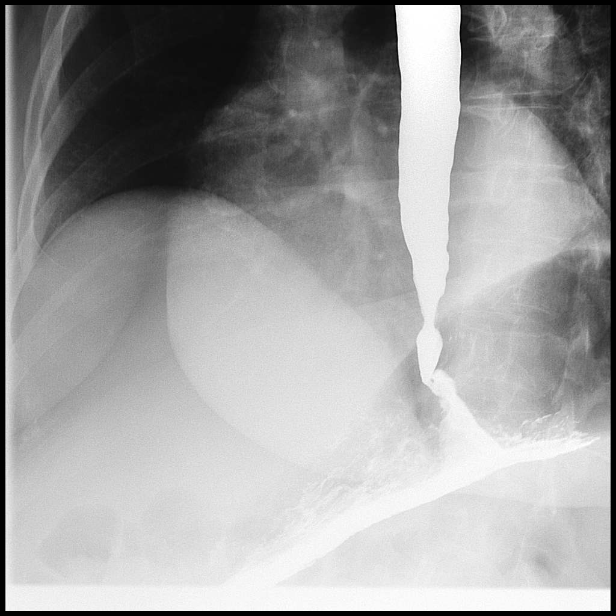
[frame 2/2]
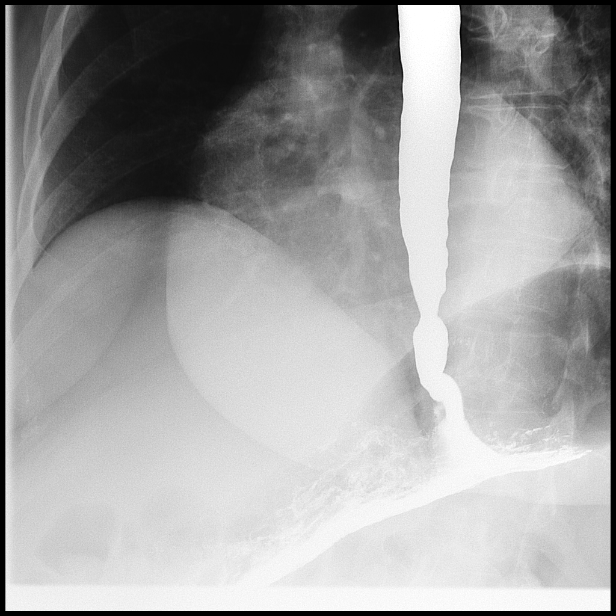

[Series 3: fluoro_barium 2fps_bw · 0.18mm/px · 1 of 1 slices shown (2 of 10)]
[im 1/1]
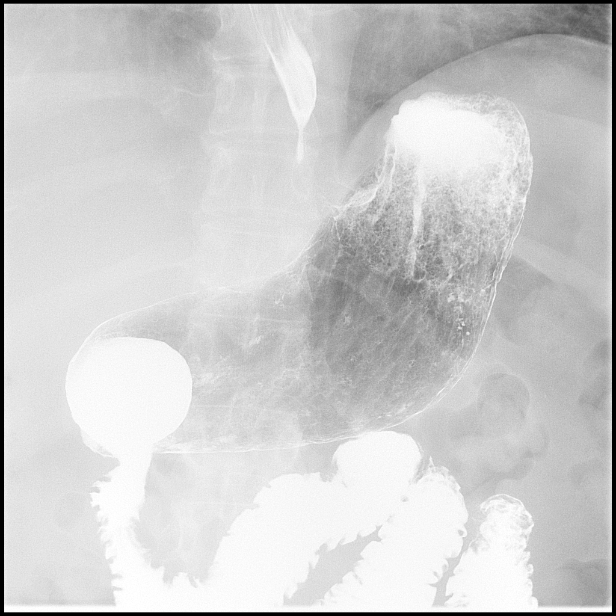

[Series 4: fluoro_barium 2fps_bw · 0.18mm/px · 1 of 1 slices shown (3 of 10)]
[im 1/1]
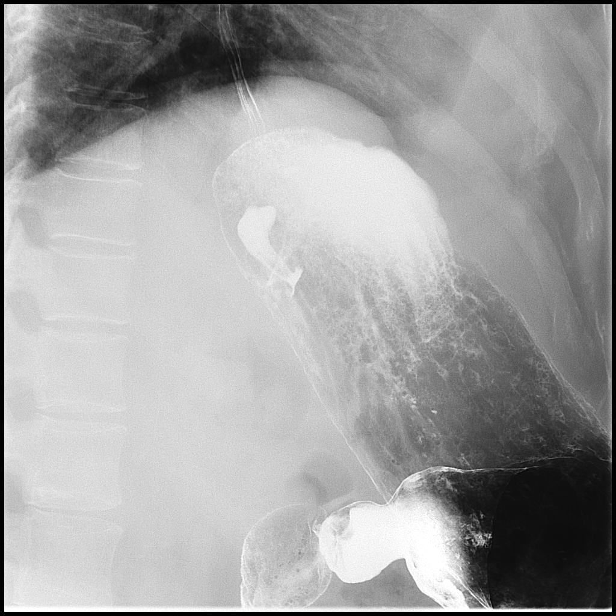

[Series 5: fluoro_barium 2fps_bw · 0.18mm/px · 1 of 1 slices shown (4 of 10)]
[im 1/1]
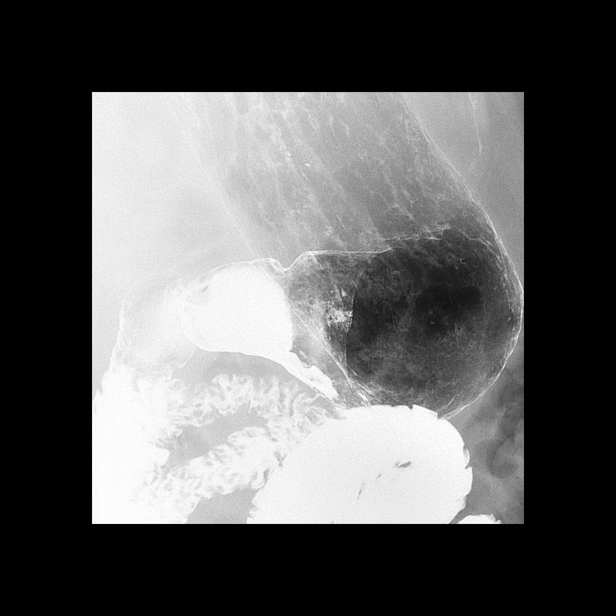

[Series 6: fluoro_barium 2fps_bw · 0.18mm/px · 1 of 1 slices shown (5 of 10)]
[im 1/1]
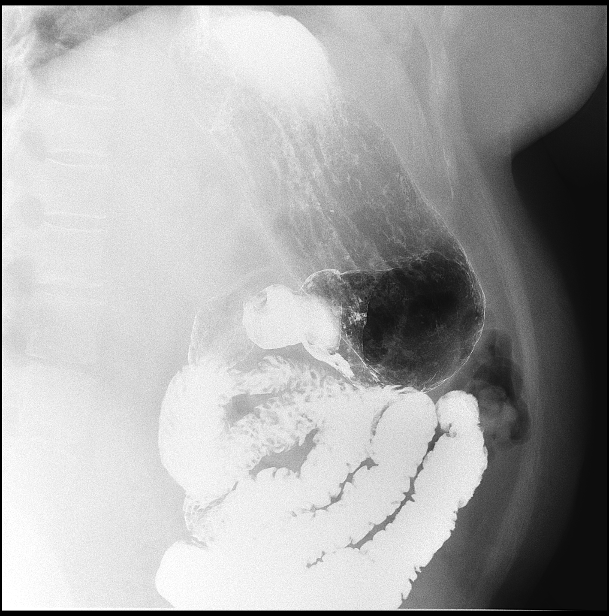

[Series 7: fluoro_barium 2fps_bw · 0.18mm/px · 1 of 1 slices shown (6 of 10)]
[im 1/1]
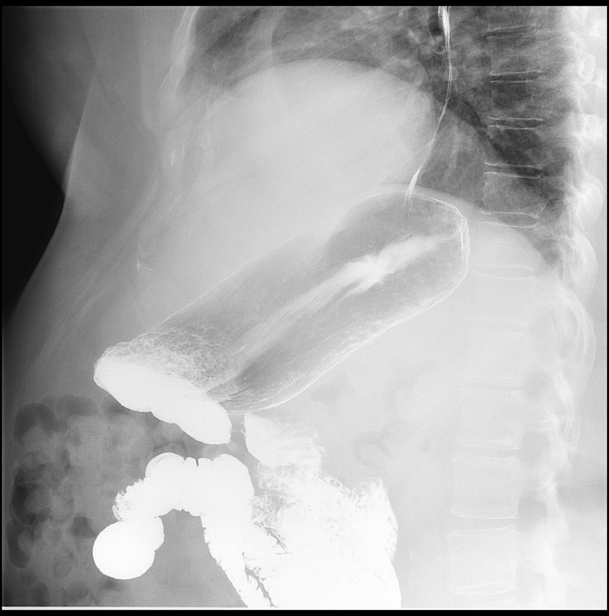

[Series 8: fluoro_barium 2fps_bw · 0.18mm/px · 1 of 1 slices shown (7 of 10)]
[im 1/1]
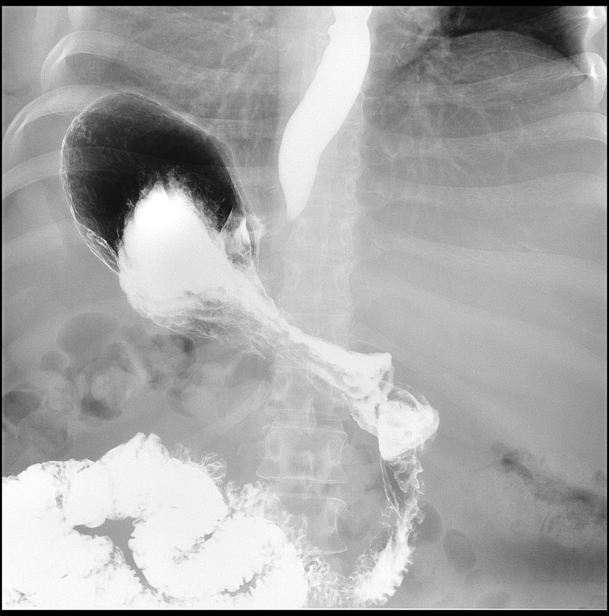

[Series 9: fluoro_barium 2fps_bw · 0.18mm/px · 1 of 1 slices shown (8 of 10)]
[im 1/1]
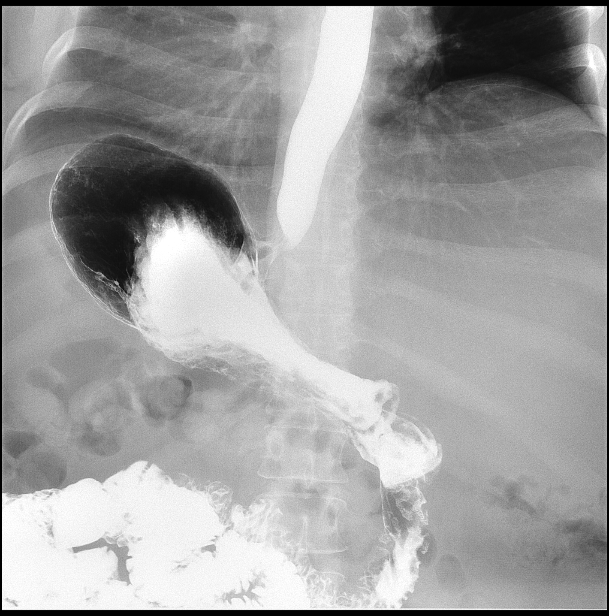

[Series 10: fluoro_barium 2fps_bw · 0.18mm/px · 1 of 1 slices shown (9 of 10)]
[im 1/1]
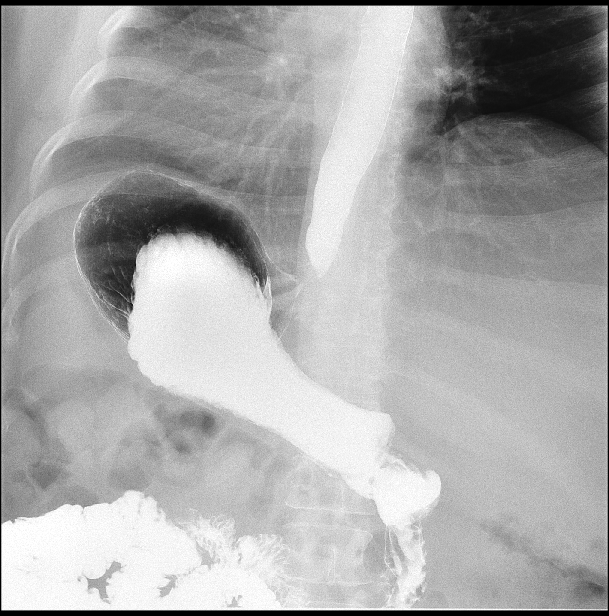

[Series 11: fluoro_barium 2fps_bw · 0.17mm/px · 1 of 1 slices shown (10 of 10)]
[im 1/1]
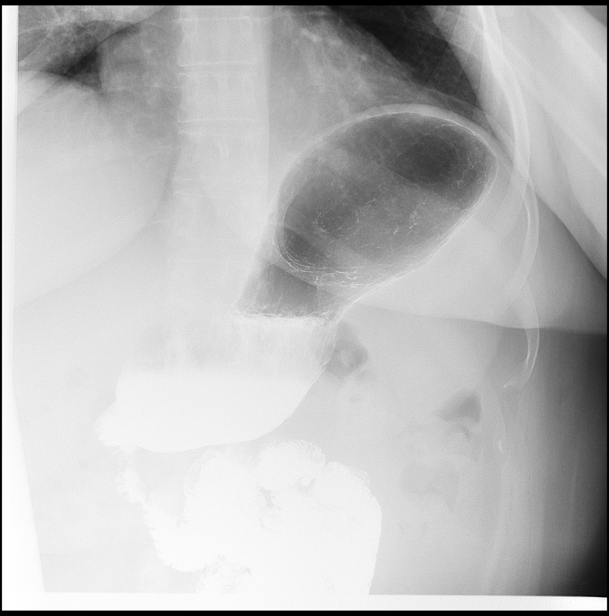

[12 of 12 positions shown; findings below may reference images not displayed]

FINDINGS: The patient ingested the thick and thin barium and gas forming
crystals without difficulty. The thoracic esophagus was normal in a
survey fashion. There was a small amount of gastroesophageal reflux.
The stomach distended well. The mucosal fold pattern was normal.
Gastric emptying was prompt. The duodenal bulb and C-loop were
normal in position and appearance. The barium tablet passed promptly
from mouth to the stomach.
IMPRESSION: 1. Small amount of gastroesophageal reflux without evidence of
esophagitis, stricture, or hiatal hernia.
2. Normal appearance of the stomach and duodenum.

## 2018-01-07 ENCOUNTER — Other Ambulatory Visit: Payer: Self-pay | Admitting: Internal Medicine

## 2018-01-07 DIAGNOSIS — Z1231 Encounter for screening mammogram for malignant neoplasm of breast: Secondary | ICD-10-CM

## 2018-02-10 ENCOUNTER — Ambulatory Visit
Admission: RE | Admit: 2018-02-10 | Discharge: 2018-02-10 | Disposition: A | Discharge status: Home / Self Care | Payer: 59 | Source: Ambulatory Visit | Attending: Internal Medicine | Admitting: Internal Medicine

## 2018-02-10 DIAGNOSIS — Z1231 Encounter for screening mammogram for malignant neoplasm of breast: Secondary | ICD-10-CM | POA: Insufficient documentation

## 2018-02-21 DIAGNOSIS — Z1212 Encounter for screening for malignant neoplasm of rectum: Secondary | ICD-10-CM | POA: Diagnosis not present

## 2018-02-21 DIAGNOSIS — Z1211 Encounter for screening for malignant neoplasm of colon: Secondary | ICD-10-CM | POA: Diagnosis not present

## 2018-03-04 ENCOUNTER — Other Ambulatory Visit: Payer: Self-pay

## 2018-03-04 ENCOUNTER — Encounter: Payer: Self-pay | Admitting: Internal Medicine

## 2018-03-04 DIAGNOSIS — R195 Other fecal abnormalities: Secondary | ICD-10-CM

## 2018-03-13 ENCOUNTER — Encounter: Payer: Self-pay | Admitting: *Deleted

## 2018-03-13 ENCOUNTER — Other Ambulatory Visit: Payer: Self-pay

## 2018-03-19 NOTE — Anesthesia Preprocedure Evaluation (Addendum)
Anesthesia Evaluation  Patient identified by MRN, date of birth, ID band Patient awake    Reviewed: Allergy & Precautions, H&P , NPO status , Patient's Chart, lab work & pertinent test results, reviewed documented beta blocker date and time   Airway Mallampati: II  TM Distance: >3 FB Neck ROM: Full    Dental no notable dental hx.    Pulmonary neg pulmonary ROS,    Pulmonary exam normal breath sounds clear to auscultation       Cardiovascular Exercise Tolerance: Good hypertension, + CAD  Normal cardiovascular exam Rhythm:Regular Rate:Normal  Mets 4+.  Able to "lug lumber" when putting up a privacy fence in the yard.  Hx of chest pain, Cathed twice (last 2017) with LAD lesions, not amenable to intervention.  Pt treated medically and has undergone weight loss surgery. 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 (not who treats her)   Neuro/Psych negative neurological ROS  negative psych ROS   GI/Hepatic negative GI ROS, Neg liver ROS,   Endo/Other  negative endocrine ROS  Renal/GU negative Renal ROS  negative genitourinary   Musculoskeletal   Abdominal   Peds  Hematology negative hematology ROS (+)   Anesthesia Other Findings   Reproductive/Obstetrics negative OB ROS                           Anesthesia Physical Anesthesia Plan  ASA: II  Anesthesia Plan: General   Post-op Pain Management:    Induction: Intravenous  PONV Risk Score and Plan:   Airway Management Planned: Natural Airway  Additional Equipment:   Intra-op Plan:   Post-operative Plan: Extubation in OR  Informed Consent: I have reviewed the patients History and Physical, chart, labs and discussed the procedure including the risks, benefits and alternatives for the proposed anesthesia with the patient or authorized representative who has indicated his/her understanding and acceptance.   Dental Advisory Given  Plan  Discussed with: CRNA  Anesthesia Plan Comments:        Anesthesia Quick Evaluation

## 2018-03-19 NOTE — Discharge Instructions (Signed)
General Anesthesia, Adult, Care After  This sheet gives you information about how to care for yourself after your procedure. Your health care provider may also give you more specific instructions. If you have problems or questions, contact your health care provider.  What can I expect after the procedure?  After the procedure, the following side effects are common:  Pain or discomfort at the IV site.  Nausea.  Vomiting.  Sore throat.  Trouble concentrating.  Feeling cold or chills.  Weak or tired.  Sleepiness and fatigue.  Soreness and body aches. These side effects can affect parts of the body that were not involved in surgery.  Follow these instructions at home:    For at least 24 hours after the procedure:  Have a responsible adult stay with you. It is important to have someone help care for you until you are awake and alert.  Rest as needed.  Do not:  Participate in activities in which you could fall or become injured.  Drive.  Use heavy machinery.  Drink alcohol.  Take sleeping pills or medicines that cause drowsiness.  Make important decisions or sign legal documents.  Take care of children on your own.  Eating and drinking  Follow any instructions from your health care provider about eating or drinking restrictions.  When you feel hungry, start by eating small amounts of foods that are soft and easy to digest (bland), such as toast. Gradually return to your regular diet.  Drink enough fluid to keep your urine pale yellow.  If you vomit, rehydrate by drinking water, juice, or clear broth.  General instructions  If you have sleep apnea, surgery and certain medicines can increase your risk for breathing problems. Follow instructions from your health care provider about wearing your sleep device:  Anytime you are sleeping, including during daytime naps.  While taking prescription pain medicines, sleeping medicines, or medicines that make you drowsy.  Return to your normal activities as told by your health care  provider. Ask your health care provider what activities are safe for you.  Take over-the-counter and prescription medicines only as told by your health care provider.  If you smoke, do not smoke without supervision.  Keep all follow-up visits as told by your health care provider. This is important.  Contact a health care provider if:  You have nausea or vomiting that does not get better with medicine.  You cannot eat or drink without vomiting.  You have pain that does not get better with medicine.  You are unable to pass urine.  You develop a skin rash.  You have a fever.  You have redness around your IV site that gets worse.  Get help right away if:  You have difficulty breathing.  You have chest pain.  You have blood in your urine or stool, or you vomit blood.  Summary  After the procedure, it is common to have a sore throat or nausea. It is also common to feel tired.  Have a responsible adult stay with you for the first 24 hours after general anesthesia. It is important to have someone help care for you until you are awake and alert.  When you feel hungry, start by eating small amounts of foods that are soft and easy to digest (bland), such as toast. Gradually return to your regular diet.  Drink enough fluid to keep your urine pale yellow.  Return to your normal activities as told by your health care provider. Ask your health care   provider what activities are safe for you.  This information is not intended to replace advice given to you by your health care provider. Make sure you discuss any questions you have with your health care provider.  Document Released: 06/25/2000 Document Revised: 11/02/2016 Document Reviewed: 11/02/2016  Elsevier Interactive Patient Education  2019 [employer]

## 2018-03-21 ENCOUNTER — Encounter
Admission: RE | Disposition: A | Discharge status: Home / Self Care | Payer: Self-pay | Source: Home / Self Care | Attending: Gastroenterology

## 2018-03-21 ENCOUNTER — Ambulatory Visit: Payer: 59 | Admitting: Anesthesiology

## 2018-03-21 ENCOUNTER — Ambulatory Visit
Admission: RE | Admit: 2018-03-21 | Discharge: 2018-03-21 | Disposition: A | Discharge status: Home / Self Care | Payer: 59 | Attending: Gastroenterology | Admitting: Gastroenterology

## 2018-03-21 DIAGNOSIS — I10 Essential (primary) hypertension: Secondary | ICD-10-CM | POA: Insufficient documentation

## 2018-03-21 DIAGNOSIS — E785 Hyperlipidemia, unspecified: Secondary | ICD-10-CM | POA: Insufficient documentation

## 2018-03-21 DIAGNOSIS — D120 Benign neoplasm of cecum: Secondary | ICD-10-CM

## 2018-03-21 DIAGNOSIS — R195 Other fecal abnormalities: Secondary | ICD-10-CM | POA: Diagnosis not present

## 2018-03-21 DIAGNOSIS — K635 Polyp of colon: Secondary | ICD-10-CM | POA: Diagnosis not present

## 2018-03-21 DIAGNOSIS — D122 Benign neoplasm of ascending colon: Secondary | ICD-10-CM

## 2018-03-21 DIAGNOSIS — I2510 Atherosclerotic heart disease of native coronary artery without angina pectoris: Secondary | ICD-10-CM | POA: Insufficient documentation

## 2018-03-21 DIAGNOSIS — K5730 Diverticulosis of large intestine without perforation or abscess without bleeding: Secondary | ICD-10-CM | POA: Insufficient documentation

## 2018-03-21 DIAGNOSIS — D124 Benign neoplasm of descending colon: Secondary | ICD-10-CM | POA: Diagnosis not present

## 2018-03-21 DIAGNOSIS — Z79899 Other long term (current) drug therapy: Secondary | ICD-10-CM | POA: Insufficient documentation

## 2018-03-21 HISTORY — PX: POLYPECTOMY: SHX5525

## 2018-03-21 HISTORY — PX: COLONOSCOPY WITH PROPOFOL: SHX5780

## 2018-03-21 SURGERY — COLONOSCOPY WITH PROPOFOL
Anesthesia: General | Site: Rectum

## 2018-03-21 MED ORDER — SODIUM CHLORIDE 0.9 % IV SOLN: INTRAVENOUS | Status: DC

## 2018-03-21 MED ORDER — STERILE WATER FOR IRRIGATION IR SOLN
Status: DC | PRN
  Administered 2018-03-21: 10:00:00

## 2018-03-21 MED ORDER — PROMETHAZINE HCL 25 MG/ML IJ SOLN: 6.2500 mg | INTRAMUSCULAR | Status: DC | PRN

## 2018-03-21 MED ORDER — FENTANYL CITRATE (PF) 100 MCG/2ML IJ SOLN: 25.0000 ug | INTRAMUSCULAR | Status: DC | PRN

## 2018-03-21 MED ORDER — LACTATED RINGERS IV SOLN
INTRAVENOUS | Status: DC
  Administered 2018-03-21: 10:00:00 via INTRAVENOUS

## 2018-03-21 MED ORDER — PROPOFOL 10 MG/ML IV BOLUS
INTRAVENOUS | Status: DC | PRN
  Administered 2018-03-21: 20 mg via INTRAVENOUS
  Administered 2018-03-21: 140 mg via INTRAVENOUS
  Administered 2018-03-21: 20 mg via INTRAVENOUS
  Administered 2018-03-21: 50 mg via INTRAVENOUS
  Administered 2018-03-21 (×3): 20 mg via INTRAVENOUS

## 2018-03-21 SURGICAL SUPPLY — 14 items
CANISTER SUCT 1200ML W/VALVE (MISCELLANEOUS) ×3 IMPLANT
CLIP HMST 235XBRD CATH ROT (MISCELLANEOUS) ×6 IMPLANT
CLIP RESOLUTION 360 11X235 (MISCELLANEOUS) ×3
ELEVIEW  INJECTABLE COMP 10 (MISCELLANEOUS) ×1
GOWN CVR UNV OPN BCK APRN NK (MISCELLANEOUS) ×4 IMPLANT
GOWN ISOL THUMB LOOP REG UNIV (MISCELLANEOUS) ×2
INJECTABLE ELEVIEW COMP 10 (MISCELLANEOUS) ×2 IMPLANT
INJECTOR VARIJECT VIN23 (MISCELLANEOUS) ×2 IMPLANT
KIT ENDO PROCEDURE OLY (KITS) ×3 IMPLANT
SNARE SHORT THROW 13M SML OVAL (MISCELLANEOUS) ×3 IMPLANT
SNARE SPIRAL (MISCELLANEOUS) ×3 IMPLANT
TRAP ETRAP POLY (MISCELLANEOUS) ×3 IMPLANT
VARIJECT INJECTOR VIN23 (MISCELLANEOUS) ×3
WATER STERILE IRR 250ML POUR (IV SOLUTION) ×3 IMPLANT

## 2018-03-21 NOTE — Anesthesia Postprocedure Evaluation (Signed)
Anesthesia Post Note  Patient: Beverly Washington  Procedure(s) Performed: COLONOSCOPY WITH PROPOFOL (N/A Rectum) POLYPECTOMY (Rectum)  Patient location during evaluation: PACU Anesthesia Type: General Level of consciousness: awake and alert Pain management: pain level controlled Vital Signs Assessment: post-procedure vital signs reviewed and stable Respiratory status: spontaneous breathing, nonlabored ventilation, respiratory function stable and patient connected to nasal cannula oxygen Cardiovascular status: blood pressure returned to baseline and stable Postop Assessment: no apparent nausea or vomiting Anesthetic complications: no    Sonali Wivell C

## 2018-03-21 NOTE — Anesthesia Procedure Notes (Signed)
Procedure Name: MAC Date/Time: 03/21/2018 10:30 AM Performed by: Cameron Ali, CRNA Pre-anesthesia Checklist: Patient identified, Emergency Drugs available, Suction available, Patient being monitored and Timeout performed Patient Re-evaluated:Patient Re-evaluated prior to induction Oxygen Delivery Method: Nasal cannula

## 2018-03-21 NOTE — Transfer of Care (Signed)
Immediate Anesthesia Transfer of Care Note  Patient: LATOYA MAULDING  Procedure(s) Performed: COLONOSCOPY WITH PROPOFOL (N/A Rectum) POLYPECTOMY (Rectum)  Patient Location: PACU  Anesthesia Type: General  Level of Consciousness: awake, alert  and patient cooperative  Airway and Oxygen Therapy: Patient Spontanous Breathing and Patient connected to supplemental oxygen  Post-op Assessment: Post-op Vital signs reviewed, Patient's Cardiovascular Status Stable, Respiratory Function Stable, Patent Airway and No signs of Nausea or vomiting  Post-op Vital Signs: Reviewed and stable  Complications: No apparent anesthesia complications

## 2018-03-21 NOTE — Op Note (Signed)
[facility] Patient Name: Beverly Washington Procedure Date: 03/21/2018 10:21 AM MRN: [id number] Account #: [id number] Date of Birth: 09-Feb-1966 Admit Type: Outpatient Age: 52 Room: [facility] OR ROOM 01 Gender: Female Note Status: Finalized Procedure:            Colonoscopy Indications:          Positive Cologuard test Providers:            [name] MD, MD Referring MD:         [name], MD (Referring MD) Medicines:            Propofol per Anesthesia Complications:        No immediate complications. Procedure:            Pre-Anesthesia Assessment:                       - Prior to the procedure, a History and Physical was                        performed, and patient medications and allergies were                        reviewed. The patient's tolerance of previous                        anesthesia was also reviewed. The risks and benefits of                        the procedure and the sedation options and risks were                        discussed with the patient. All questions were                        answered, and informed consent was obtained. Prior                        Anticoagulants: The patient has taken no previous                        anticoagulant or antiplatelet agents. ASA Grade                        Assessment: II - A patient with mild systemic disease.                        After reviewing the risks and benefits, the patient was                        deemed in satisfactory condition to undergo the                        procedure.                       After obtaining informed consent, the colonoscope was                        passed under direct vision. Throughout the procedure,  the patient's blood pressure, pulse, and oxygen                        saturations were monitored continuously. The                        Colonoscope was introduced through the anus and                        advanced to  the the terminal ileum. The colonoscopy was                        performed without difficulty. The patient tolerated the                        procedure well. The quality of the bowel preparation                        was excellent. Findings:      The perianal and digital rectal examinations were normal.      Four sessile polyps were found in the ascending colon. The polyps were 4       to 9 mm in size. These polyps were removed with a cold snare. Resection       and retrieval were complete.      A 15 mm polyp was found in the ileocecal valve. The polyp was sessile.       Area was successfully injected with 5 mL saline with indigo carmine for       a lift polypectomy. The polyp was removed with a hot snare. Resection       and retrieval were complete. To prevent bleeding post-intervention, two       hemostatic clips were successfully placed (MR conditional). There was no       bleeding at the end of the procedure.      Three sessile polyps were found in the cecum. The polyps were 4 to 7 mm       in size. These polyps were removed with a cold snare. Resection and       retrieval were complete.      Three sessile polyps were found in the descending colon. The polyps were       4 to 7 mm in size. These polyps were removed with a cold snare.       Resection and retrieval were complete. To prevent bleeding       post-intervention, one hemostatic clip was successfully placed (MR       conditional). There was no bleeding at the end of the procedure.      Multiple small-mouthed diverticula were found in the sigmoid colon. Impression:           - Four 4 to 9 mm polyps in the ascending colon, removed                        with a cold snare. Resected and retrieved.                       - One 15 mm polyp at the ileocecal valve, removed with                        a hot snare.  Resected and retrieved. Injected. Clips                        (MR conditional) were placed.                       -  Three 4 to 7 mm polyps in the cecum, removed with a                        cold snare. Resected and retrieved.                       - Three 4 to 7 mm polyps in the descending colon,                        removed with a cold snare. Resected and retrieved.                       - Diverticulosis in the sigmoid colon. Recommendation:       - Discharge patient to home.                       - Resume previous diet.                       - Continue present medications.                       - Await pathology results.                       - Repeat colonoscopy in 3 months to review the                        polypectomy site. Procedure Code(s):    --- Professional ---                       [id number], Colonoscopy, flexible; with removal of tumor(s),                        polyp(s), or other lesion(s) by snare technique                       [id number], Colonoscopy, flexible; with directed submucosal                        injection(s), any substance Diagnosis Code(s):    --- Professional ---                       R19.5, Other fecal abnormalities                       D12.0, Benign neoplasm of cecum                       D12.4, Benign neoplasm of descending colon                       D12.2, Benign neoplasm of ascending colon CPT copyright 2018 American Medical Association. All rights reserved. The codes documented in this report are preliminary and upon coder review may  be revised to meet current  compliance requirements. [name] MD, MD 03/21/2018 11:15:35 AM This report has been signed electronically. Number of Addenda: 0 Note Initiated On: 03/21/2018 10:21 AM Scope Withdrawal Time: 0 hours 38 minutes 24 seconds  Total Procedure Duration: 0 hours 44 minutes 8 seconds       [facility]

## 2018-03-21 NOTE — Anesthesia Procedure Notes (Signed)
Date/Time: 03/21/2018 10:22 AM Performed by: Cameron Ali, CRNA Pre-anesthesia Checklist: Patient identified, Emergency Drugs available, Suction available, Timeout performed and Patient being monitored Patient Re-evaluated:Patient Re-evaluated prior to induction Oxygen Delivery Method: Nasal cannula Placement Confirmation: positive ETCO2

## 2018-03-21 NOTE — H&P (Signed)
Beverly Lame, MD [address] [place], [place] [address] Phone:[phone] Fax : [phone]  Primary Care Physician:  [name], MD Primary Gastroenterologist:  [name]  Pre-Procedure History & Physical: HPI:  Beverly Washington is a 52 y.o. female is here for an colonoscopy.   Past Medical History:  Diagnosis Date  . Complication of anesthesia 2014   low bp with epidural  . Coronary artery disease    Cardiac catheterization in February 2016 showed an 80% discrete mid LAD stenosis at the origin of first diagonal branch. The LAD was very small after the diagonal branch less than 2.5 mm. Ejection fraction was 65% with mildly elevated left ventricular end-diastolic pressure.  . Hyperlipidemia   . Hypertension   . Iritis    right eye  . Pre-diabetes     Past Surgical History:  Procedure Laterality Date  . abdominal plasty    . BREAST REDUCTION SURGERY    . CARDIAC CATHETERIZATION  05/07/2014   [facility]  . CARDIAC CATHETERIZATION Left 08/05/2015   Procedure: Left Heart Cath and Coronary Angiography;  Surgeon: Wellington Hampshire, MD;  Location: [facility];  Service: Cardiovascular;  Laterality: Left;  . CATARACT EXTRACTION W/ INTRAOCULAR LENS IMPLANT Right   . CESAREAN SECTION     x3  . CHOLECYSTECTOMY    . EYE MUSCLE SURGERY    . HERNIA REPAIR    . LAPAROSCOPIC GASTRIC SLEEVE RESECTION N/A 03/19/2016   Procedure: LAPAROSCOPIC  LYSIS OF ADHESIONS, GASTRIC SLEEVE RESECTION, UPPER ENDO;  Surgeon: Johnathan Hausen, MD;  Location: [facility];  Service: General;  Laterality: N/A;  . LAPAROSCOPIC LYSIS OF ADHESIONS  03/19/2016   Procedure: LAPAROSCOPIC LYSIS OF ADHESIONS;  Surgeon: Johnathan Hausen, MD;  Location: [facility];  Service: General;;  . OOPHORECTOMY     right  . REDUCTION MAMMAPLASTY Bilateral 2000  . TOTAL ABDOMINAL HYSTERECTOMY    . UPPER GI ENDOSCOPY  03/19/2016   Procedure: UPPER GI ENDOSCOPY;  Surgeon: Johnathan Hausen, MD;  Location: [facility];  Service:  General;;    Prior to Admission medications   Medication Sig Start Date End Date Taking? Authorizing Provider  Icosapent Ethyl (VASCEPA PO) Take by mouth daily.   Yes [provider]  Multiple Vitamin (MULTI-VITAMINS) TABS Take 1 tablet by mouth daily.    Yes [provider]    Allergies as of 03/04/2018 - Review Complete 09/25/2016  Allergen Reaction Noted  . Ceftriaxone Rash 05/06/2014  . Hydromorphone Rash 05/06/2014  . Latex Rash 05/06/2014  . Olmesartan Palpitations 05/06/2014    Family History  Problem Relation Age of Onset  . Heart disease Mother   . Heart attack Mother   . Pulmonary embolism Mother   . Breast cancer Mother 8  . Heart attack Maternal Uncle   . Heart disease Maternal Uncle   . Heart attack Paternal Uncle   . Heart attack Maternal Uncle   . Heart disease Maternal Uncle   . Heart attack Maternal Uncle   . Heart disease Maternal Uncle   . Heart attack Maternal Uncle   . Heart disease Maternal Uncle   . Heart attack Maternal Uncle   . Heart disease Maternal Uncle   . Heart attack Maternal Uncle   . Heart disease Maternal Uncle     Social History   Socioeconomic History  . Marital status: Divorced    Spouse name: Not on file  . Number of children: Not on file  . Years of education:  Not on file  . Highest education level: Not on file  Occupational History  . Not on file  Social Needs  . Financial resource strain: Not on file  . Food insecurity:    Worry: Not on file    Inability: Not on file  . Transportation needs:    Medical: Not on file    Non-medical: Not on file  Tobacco Use  . Smoking status: Never Smoker  . Smokeless tobacco: Never Used  Substance and Sexual Activity  . Alcohol use: No  . Drug use: No  . Sexual activity: Yes    Birth control/protection: Surgical  Lifestyle  . Physical activity:    Days per week: Not on file    Minutes per session: Not on file  . Stress: Not on file  Relationships  .  Social connections:    Talks on phone: Not on file    Gets together: Not on file    Attends religious service: Not on file    Active member of club or organization: Not on file    Attends meetings of clubs or organizations: Not on file    Relationship status: Not on file  . Intimate partner violence:    Fear of current or ex partner: Not on file    Emotionally abused: Not on file    Physically abused: Not on file    Forced sexual activity: Not on file  Other Topics Concern  . Not on file  Social History Narrative  . Not on file    Review of Systems: See HPI, otherwise negative ROS  Physical Exam: Pulse 65   Temp 97.7 F (36.5 C) (Temporal)   Resp 16   Ht 5\' 4"  (1.626 m)   Wt 86.6 kg   SpO2 100%   BMI 32.79 kg/m  General:   Alert,  pleasant and cooperative in NAD Head:  Normocephalic and atraumatic. Neck:  Supple; no masses or thyromegaly. Lungs:  Clear throughout to auscultation.    Heart:  Regular rate and rhythm. Abdomen:  Soft, nontender and nondistended. Normal bowel sounds, without guarding, and without rebound.   Neurologic:  Alert and  oriented x4;  grossly normal neurologically.  Impression/Plan: Isadore D Nghiem is here for an colonoscopy to be performed for positive cologard  Risks, benefits, limitations, and alternatives regarding  colonoscopy have been reviewed with the patient.  Questions have been answered.  All parties agreeable.   Beverly Lame, MD  03/21/2018, 10:13 AM

## 2018-03-25 LAB — SURGICAL PATHOLOGY

## 2018-03-28 ENCOUNTER — Encounter: Payer: Self-pay | Admitting: Gastroenterology

## 2018-03-28 ENCOUNTER — Other Ambulatory Visit: Payer: Self-pay

## 2018-03-28 DIAGNOSIS — Z86010 Personal history of colonic polyps: Secondary | ICD-10-CM

## 2018-03-28 MED ORDER — NA SULFATE-K SULFATE-MG SULF 17.5-3.13-1.6 GM/177ML PO SOLN: 1.0000 | ORAL | 0 refills | Status: DC

## 2018-06-03 ENCOUNTER — Telehealth: Payer: Self-pay | Admitting: Gastroenterology

## 2018-06-03 NOTE — Telephone Encounter (Signed)
Pt is calling she has a procedure with [name] coming up please fax instructions and Fiber diet to [phone] Attention [facility]

## 2018-06-05 NOTE — Telephone Encounter (Signed)
Colonoscopy instructions faxed to pt per request at [phone].

## 2018-06-18 ENCOUNTER — Telehealth: Payer: Self-pay | Admitting: Gastroenterology

## 2018-06-18 NOTE — Telephone Encounter (Signed)
Pt notified procedure on 06/27/18 will be cancelled due to COVID 19. Pt will be contacted once issue passes.

## 2018-06-18 NOTE — Telephone Encounter (Signed)
Pt is calling to check if her procedure for 06/27/18 is still on? Please call pt

## 2018-06-27 ENCOUNTER — Encounter: Admission: RE | Payer: Self-pay | Source: Home / Self Care

## 2018-06-27 ENCOUNTER — Ambulatory Visit: Admission: RE | Admit: 2018-06-27 | Payer: 59 | Source: Home / Self Care | Admitting: Gastroenterology

## 2018-06-27 SURGERY — COLONOSCOPY WITH PROPOFOL
Anesthesia: Choice

## 2018-07-07 ENCOUNTER — Other Ambulatory Visit: Payer: Self-pay

## 2018-07-07 DIAGNOSIS — Z86010 Personal history of colonic polyps: Secondary | ICD-10-CM

## 2018-08-26 DIAGNOSIS — S91342A Puncture wound with foreign body, left foot, initial encounter: Secondary | ICD-10-CM | POA: Diagnosis not present

## 2018-09-19 ENCOUNTER — Telehealth: Payer: Self-pay | Admitting: Gastroenterology

## 2018-09-19 NOTE — Telephone Encounter (Signed)
Pt is calling she has a procedure 10/02/18 and is not scheduled for Covid19 testing please call pt to get that scheduled

## 2018-09-22 ENCOUNTER — Other Ambulatory Visit: Payer: Self-pay

## 2018-09-22 DIAGNOSIS — Z01812 Encounter for preprocedural laboratory examination: Secondary | ICD-10-CM

## 2018-09-22 NOTE — Telephone Encounter (Signed)
Returned patients call.  COVID test has been ordered for her pre-procedural lab Colonoscopy 10/02/18.  COVID test date Monday 09/29/18.    Thanks [employer]

## 2018-09-25 DIAGNOSIS — H02401 Unspecified ptosis of right eyelid: Secondary | ICD-10-CM | POA: Diagnosis not present

## 2018-09-25 DIAGNOSIS — I10 Essential (primary) hypertension: Secondary | ICD-10-CM | POA: Diagnosis not present

## 2018-09-25 DIAGNOSIS — Z961 Presence of intraocular lens: Secondary | ICD-10-CM | POA: Diagnosis not present

## 2018-09-25 DIAGNOSIS — H2512 Age-related nuclear cataract, left eye: Secondary | ICD-10-CM | POA: Diagnosis not present

## 2018-09-29 ENCOUNTER — Other Ambulatory Visit
Admission: RE | Admit: 2018-09-29 | Discharge: 2018-09-29 | Disposition: A | Discharge status: Home / Self Care | Payer: 59 | Source: Ambulatory Visit | Attending: Gastroenterology | Admitting: Gastroenterology

## 2018-09-29 ENCOUNTER — Other Ambulatory Visit: Payer: Self-pay

## 2018-09-29 DIAGNOSIS — Z01812 Encounter for preprocedural laboratory examination: Secondary | ICD-10-CM | POA: Diagnosis not present

## 2018-09-29 DIAGNOSIS — Z1159 Encounter for screening for other viral diseases: Secondary | ICD-10-CM | POA: Insufficient documentation

## 2018-09-30 LAB — NOVEL CORONAVIRUS, NAA (HOSP ORDER, SEND-OUT TO REF LAB; TAT 18-24 HRS): SARS-CoV-2, NAA: NOT DETECTED

## 2018-10-01 NOTE — Discharge Instructions (Signed)

## 2018-10-02 ENCOUNTER — Ambulatory Visit
Admission: RE | Admit: 2018-10-02 | Discharge: 2018-10-02 | Disposition: A | Discharge status: Home / Self Care | Payer: 59 | Attending: Gastroenterology | Admitting: Gastroenterology

## 2018-10-02 ENCOUNTER — Ambulatory Visit: Payer: 59 | Admitting: Anesthesiology

## 2018-10-02 ENCOUNTER — Other Ambulatory Visit: Payer: Self-pay

## 2018-10-02 ENCOUNTER — Encounter
Admission: RE | Disposition: A | Discharge status: Home / Self Care | Payer: Self-pay | Source: Home / Self Care | Attending: Gastroenterology

## 2018-10-02 DIAGNOSIS — D124 Benign neoplasm of descending colon: Secondary | ICD-10-CM | POA: Diagnosis not present

## 2018-10-02 DIAGNOSIS — K529 Noninfective gastroenteritis and colitis, unspecified: Secondary | ICD-10-CM | POA: Insufficient documentation

## 2018-10-02 DIAGNOSIS — Z09 Encounter for follow-up examination after completed treatment for conditions other than malignant neoplasm: Secondary | ICD-10-CM | POA: Insufficient documentation

## 2018-10-02 DIAGNOSIS — I10 Essential (primary) hypertension: Secondary | ICD-10-CM | POA: Diagnosis not present

## 2018-10-02 DIAGNOSIS — Z860100 Personal history of colon polyps, unspecified: Secondary | ICD-10-CM

## 2018-10-02 DIAGNOSIS — D126 Benign neoplasm of colon, unspecified: Secondary | ICD-10-CM | POA: Diagnosis not present

## 2018-10-02 DIAGNOSIS — K635 Polyp of colon: Secondary | ICD-10-CM | POA: Diagnosis not present

## 2018-10-02 DIAGNOSIS — I2510 Atherosclerotic heart disease of native coronary artery without angina pectoris: Secondary | ICD-10-CM | POA: Diagnosis not present

## 2018-10-02 DIAGNOSIS — K641 Second degree hemorrhoids: Secondary | ICD-10-CM | POA: Insufficient documentation

## 2018-10-02 DIAGNOSIS — D120 Benign neoplasm of cecum: Secondary | ICD-10-CM | POA: Insufficient documentation

## 2018-10-02 HISTORY — PX: POLYPECTOMY: SHX5525

## 2018-10-02 HISTORY — PX: COLONOSCOPY WITH PROPOFOL: SHX5780

## 2018-10-02 SURGERY — COLONOSCOPY WITH PROPOFOL
Anesthesia: General | Site: Rectum

## 2018-10-02 MED ORDER — STERILE WATER FOR IRRIGATION IR SOLN
Status: DC | PRN
  Administered 2018-10-02: 11:00:00 15 mL

## 2018-10-02 MED ORDER — LIDOCAINE HCL (CARDIAC) PF 100 MG/5ML IV SOSY
PREFILLED_SYRINGE | INTRAVENOUS | Status: DC | PRN
  Administered 2018-10-02: 30 mg via INTRAVENOUS

## 2018-10-02 MED ORDER — ACETAMINOPHEN 160 MG/5ML PO SOLN: 325.0000 mg | Freq: Once | ORAL | Status: DC

## 2018-10-02 MED ORDER — LACTATED RINGERS IV SOLN
INTRAVENOUS | Status: DC
  Administered 2018-10-02: 10:00:00 via INTRAVENOUS

## 2018-10-02 MED ORDER — SODIUM CHLORIDE 0.9 % IV SOLN: INTRAVENOUS | Status: DC

## 2018-10-02 MED ORDER — ACETAMINOPHEN 325 MG PO TABS: 325.0000 mg | ORAL_TABLET | Freq: Once | ORAL | Status: DC

## 2018-10-02 MED ORDER — PROPOFOL 10 MG/ML IV BOLUS
INTRAVENOUS | Status: DC | PRN
  Administered 2018-10-02: 30 mg via INTRAVENOUS
  Administered 2018-10-02 (×2): 40 mg via INTRAVENOUS
  Administered 2018-10-02: 30 mg via INTRAVENOUS
  Administered 2018-10-02: 100 mg via INTRAVENOUS
  Administered 2018-10-02: 30 mg via INTRAVENOUS
  Administered 2018-10-02: 20 mg via INTRAVENOUS
  Administered 2018-10-02: 30 mg via INTRAVENOUS
  Administered 2018-10-02: 20 mg via INTRAVENOUS

## 2018-10-02 SURGICAL SUPPLY — 6 items
CANISTER SUCT 1200ML W/VALVE (MISCELLANEOUS) ×2 IMPLANT
FORCEPS BIOP RAD 4 LRG CAP 4 (CUTTING FORCEPS) ×1 IMPLANT
GOWN CVR UNV OPN BCK APRN NK (MISCELLANEOUS) ×2 IMPLANT
GOWN ISOL THUMB LOOP REG UNIV (MISCELLANEOUS) ×2
KIT ENDO PROCEDURE OLY (KITS) ×2 IMPLANT
WATER STERILE IRR 250ML POUR (IV SOLUTION) ×2 IMPLANT

## 2018-10-02 NOTE — Op Note (Signed)
[facility] Gastroenterology Patient Name: Beverly Washington Procedure Date: 10/02/2018 7:59 AM MRN: [id number] Account #: [id number] Date of Birth: Jan 05, 1966 Admit Type: Outpatient Age: 53 Room: [facility] OR ROOM 01 Gender: Female Note Status: Finalized Procedure:            Colonoscopy Indications:          High risk colon cancer surveillance: Personal history                        of colonic polyps Providers:            [name] MD, MD Referring MD:         [name], MD (Referring MD) Medicines:            Propofol per Anesthesia Complications:        No immediate complications. Procedure:            Pre-Anesthesia Assessment:                       - Prior to the procedure, a History and Physical was                        performed, and patient medications and allergies were                        reviewed. The patient's tolerance of previous                        anesthesia was also reviewed. The risks and benefits of                        the procedure and the sedation options and risks were                        discussed with the patient. All questions were                        answered, and informed consent was obtained. Prior                        Anticoagulants: The patient has taken no previous                        anticoagulant or antiplatelet agents. ASA Grade                        Assessment: II - A patient with mild systemic disease.                        After reviewing the risks and benefits, the patient was                        deemed in satisfactory condition to undergo the                        procedure.                       After obtaining informed consent, the colonoscope was  passed under direct vision. Throughout the procedure,                        the patient's blood pressure, pulse, and oxygen                        saturations were monitored continuously. The was                        introduced  through the anus and advanced to the the                        cecum, identified by appendiceal orifice and ileocecal                        valve. The colonoscopy was performed without                        difficulty. The patient tolerated the procedure well.                        The quality of the bowel preparation was excellent. Findings:      The perianal and digital rectal examinations were normal.      Two sessile polyps were found in the cecum. The polyps were 2 to 3 mm in       size. These polyps were removed with a cold biopsy forceps. Resection       and retrieval were complete.      Three sessile polyps were found in the descending colon. The polyps were       2 to 4 mm in size. These polyps were removed with a cold biopsy forceps.       Resection and retrieval were complete.      A 3 mm polyp was found in the rectum. The polyp was sessile. The polyp       was removed with a cold biopsy forceps. Resection and retrieval were       complete.      Non-bleeding internal hemorrhoids were found during retroflexion. The       hemorrhoids were Grade II (internal hemorrhoids that prolapse but reduce       spontaneously). Impression:           - Two 2 to 3 mm polyps in the cecum, removed with a                        cold biopsy forceps. Resected and retrieved.                       - Three 2 to 4 mm polyps in the descending colon,                        removed with a cold biopsy forceps. Resected and                        retrieved.                       - One 3 mm polyp in the rectum, removed with a cold  biopsy forceps. Resected and retrieved.                       - Non-bleeding internal hemorrhoids. Recommendation:       - Discharge patient to home.                       - Resume previous diet.                       - Continue present medications.                       - Await pathology results.                       - Repeat colonoscopy in 5 years  for surveillance. Procedure Code(s):    --- Professional ---                       [phone], Colonoscopy, flexible; with biopsy, single or                        multiple Diagnosis Code(s):    --- Professional ---                       Z86.010, Personal history of colonic polyps                       K63.5, Polyp of colon CPT copyright 2019 American Medical Association. All rights reserved. The codes documented in this report are preliminary and upon coder review may  be revised to meet current compliance requirements. [name] MD, MD 10/02/2018 11:16:16 AM This report has been signed electronically. Number of Addenda: 0 Note Initiated On: 10/02/2018 7:59 AM Scope Withdrawal Time: 0 hours 11 minutes 22 seconds  Total Procedure Duration: 0 hours 15 minutes 23 seconds  Estimated Blood Loss: Estimated blood loss: none.      [facility]

## 2018-10-02 NOTE — Anesthesia Postprocedure Evaluation (Signed)
Anesthesia Post Note  Patient: Beverly Washington  Procedure(s) Performed: COLONOSCOPY WITH BIOPSIES (N/A Rectum) POLYPECTOMY (N/A Rectum)  Patient location during evaluation: PACU Anesthesia Type: General Level of consciousness: awake and alert and oriented Pain management: satisfactory to patient Vital Signs Assessment: post-procedure vital signs reviewed and stable Respiratory status: spontaneous breathing, nonlabored ventilation and respiratory function stable Cardiovascular status: blood pressure returned to baseline and stable Postop Assessment: Adequate PO intake and No signs of nausea or vomiting Anesthetic complications: no    Raliegh Ip

## 2018-10-02 NOTE — Transfer of Care (Signed)
Immediate Anesthesia Transfer of Care Note  Patient: Beverly Washington  Procedure(s) Performed: COLONOSCOPY WITH BIOPSIES (N/A Rectum) POLYPECTOMY (N/A Rectum)  Patient Location: PACU  Anesthesia Type: General  Level of Consciousness: awake, alert  and patient cooperative  Airway and Oxygen Therapy: Patient Spontanous Breathing and Patient connected to supplemental oxygen  Post-op Assessment: Post-op Vital signs reviewed, Patient's Cardiovascular Status Stable, Respiratory Function Stable, Patent Airway and No signs of Nausea or vomiting  Post-op Vital Signs: Reviewed and stable  Complications: No apparent anesthesia complications

## 2018-10-02 NOTE — Anesthesia Preprocedure Evaluation (Signed)
Anesthesia Evaluation  Patient identified by MRN, date of birth, ID band Patient awake    Reviewed: Allergy & Precautions, H&P , NPO status , Patient's Chart, lab work & pertinent test results  Airway Mallampati: III  TM Distance: >3 FB Neck ROM: full    Dental no notable dental hx.    Pulmonary    Pulmonary exam normal breath sounds clear to auscultation       Cardiovascular hypertension, + CAD  Normal cardiovascular exam Rhythm:regular Rate:Normal     Neuro/Psych    GI/Hepatic   Endo/Other    Renal/GU      Musculoskeletal   Abdominal   Peds  Hematology   Anesthesia Other Findings   Reproductive/Obstetrics                             Anesthesia Physical Anesthesia Plan  ASA: II  Anesthesia Plan: General   Post-op Pain Management:    Induction: Intravenous  PONV Risk Score and Plan: 3 and Propofol infusion and Treatment may vary due to age or medical condition  Airway Management Planned: Natural Airway  Additional Equipment:   Intra-op Plan:   Post-operative Plan:   Informed Consent: I have reviewed the patients History and Physical, chart, labs and discussed the procedure including the risks, benefits and alternatives for the proposed anesthesia with the patient or authorized representative who has indicated his/her understanding and acceptance.       Plan Discussed with: CRNA  Anesthesia Plan Comments:         Anesthesia Quick Evaluation

## 2018-10-02 NOTE — H&P (Signed)
[name], MD [address] [place], [place] [address] Phone:[phone] Fax : [phone]  Primary Care Physician:  [name], MD Primary Gastroenterologist:  [name]  Pre-Procedure History & Physical: HPI:  Beverly Washington is a 53 y.o. female is here for an colonoscopy.   Past Medical History:  Diagnosis Date   Complication of anesthesia 2014   low bp with epidural   Coronary artery disease    Cardiac catheterization in February 2016 showed an 80% discrete mid LAD stenosis at the origin of first diagonal branch. The LAD was very small after the diagonal branch less than 2.5 mm. Ejection fraction was 65% with mildly elevated left ventricular end-diastolic pressure.   Hyperlipidemia    Hypertension    Iritis    right eye   Pre-diabetes     Past Surgical History:  Procedure Laterality Date   abdominal plasty     BREAST REDUCTION SURGERY     CARDIAC CATHETERIZATION  05/07/2014   [facility]   CARDIAC CATHETERIZATION Left 08/05/2015   Procedure: Left Heart Cath and Coronary Angiography;  Surgeon: Wellington Hampshire, MD;  Location: [place] [facility];  Service: Cardiovascular;  Laterality: Left;   CATARACT EXTRACTION W/ INTRAOCULAR LENS IMPLANT Right    CESAREAN SECTION     x3   CHOLECYSTECTOMY     COLONOSCOPY WITH PROPOFOL N/A 03/21/2018   Procedure: COLONOSCOPY WITH PROPOFOL;  Surgeon: [name], MD;  Location: [place];  Service: Endoscopy;  Laterality: N/A;  Latex sensitivity   EYE MUSCLE SURGERY     HERNIA REPAIR     LAPAROSCOPIC GASTRIC SLEEVE RESECTION N/A 03/19/2016   Procedure: LAPAROSCOPIC  LYSIS OF ADHESIONS, GASTRIC SLEEVE RESECTION, UPPER ENDO;  Surgeon: Johnathan Hausen, MD;  Location: [facility];  Service: General;  Laterality: N/A;   LAPAROSCOPIC LYSIS OF ADHESIONS  03/19/2016   Procedure: LAPAROSCOPIC LYSIS OF ADHESIONS;  Surgeon: Johnathan Hausen, MD;  Location: [facility];  Service: General;;   OOPHORECTOMY     right     POLYPECTOMY  03/21/2018   Procedure: POLYPECTOMY;  Surgeon: [name], MD;  Location: [place];  Service: Endoscopy;;   REDUCTION MAMMAPLASTY Bilateral 2000   TOTAL ABDOMINAL HYSTERECTOMY     UPPER GI ENDOSCOPY  03/19/2016   Procedure: UPPER GI ENDOSCOPY;  Surgeon: Johnathan Hausen, MD;  Location: [facility];  Service: General;;    Prior to Admission medications   Medication Sig Start Date End Date Taking? Authorizing Provider  Na Sulfate-K Sulfate-Mg Sulf (SUPREP BOWEL PREP KIT) 17.5-3.13-1.6 GM/177ML SOLN Take 1 kit by mouth as directed. 03/28/18  Yes [name], MD  Icosapent Ethyl (VASCEPA PO) Take by mouth daily.    [provider]  Multiple Vitamin (MULTI-VITAMINS) TABS Take 1 tablet by mouth daily.     [provider]    Allergies as of 07/07/2018 - Review Complete 03/21/2018  Allergen Reaction Noted   Ceftriaxone Rash 05/06/2014   Hydromorphone Rash 05/06/2014   Latex Rash 05/06/2014   Olmesartan Palpitations 05/06/2014    Family History  Problem Relation Age of Onset   Heart disease Mother    Heart attack Mother    Pulmonary embolism Mother    Breast cancer Mother 62   Heart attack Maternal Uncle    Heart disease Maternal Uncle    Heart attack Paternal Uncle    Heart attack Maternal Uncle    Heart disease Maternal Uncle    Heart attack Maternal Uncle    Heart disease  Maternal Uncle    Heart attack Maternal Uncle    Heart disease Maternal Uncle    Heart attack Maternal Uncle    Heart disease Maternal Uncle    Heart attack Maternal Uncle    Heart disease Maternal Uncle     Social History   Socioeconomic History   Marital status: Divorced    Spouse name: Not on file   Number of children: Not on file   Years of education: Not on file   Highest education level: Not on file  Occupational History   Not on file  Social Needs   Financial resource strain: Not on file   Food insecurity    Worry: Not  on file    Inability: Not on file   Transportation needs    Medical: Not on file    Non-medical: Not on file  Tobacco Use   Smoking status: Never Smoker   Smokeless tobacco: Never Used  Substance and Sexual Activity   Alcohol use: No   Drug use: No   Sexual activity: Yes    Birth control/protection: Surgical  Lifestyle   Physical activity    Days per week: Not on file    Minutes per session: Not on file   Stress: Not on file  Relationships   Social connections    Talks on phone: Not on file    Gets together: Not on file    Attends religious service: Not on file    Active member of club or organization: Not on file    Attends meetings of clubs or organizations: Not on file    Relationship status: Not on file   Intimate partner violence    Fear of current or ex partner: Not on file    Emotionally abused: Not on file    Physically abused: Not on file    Forced sexual activity: Not on file  Other Topics Concern   Not on file  Social History Narrative   Not on file    Review of Systems: See HPI, otherwise negative ROS  Physical Exam: BP (!) 171/91    Pulse 62    Temp (!) 97.3 F (36.3 C)    Resp 18    Ht _0  (1.626 m)    Wt 87.7 kg    SpO2 100%    BMI 33.18 kg/m  General:   Alert,  pleasant and cooperative in NAD Head:  Normocephalic and atraumatic. Neck:  Supple; no masses or thyromegaly. Lungs:  Clear throughout to auscultation.    Heart:  Regular rate and rhythm. Abdomen:  Soft, nontender and nondistended. Normal bowel sounds, without guarding, and without rebound.   Neurologic:  Alert and  oriented x4;  grossly normal neurologically.  Impression/Plan: Beverly Washington is here for an colonoscopy to be performed for history of colon polyps  Risks, benefits, limitations, and alternatives regarding  colonoscopy have been reviewed with the patient.  Questions have been answered.  All parties agreeable.   [name], MD  10/02/2018, 10:36 AM

## 2018-10-02 NOTE — Anesthesia Procedure Notes (Signed)
Date/Time: 10/02/2018 10:51 AM Performed by: Cameron Ali, CRNA Pre-anesthesia Checklist: Patient identified, Emergency Drugs available, Suction available, Timeout performed and Patient being monitored Patient Re-evaluated:Patient Re-evaluated prior to induction Oxygen Delivery Method: Nasal cannula Placement Confirmation: positive ETCO2

## 2018-10-07 LAB — SURGICAL PATHOLOGY

## 2018-10-29 DIAGNOSIS — Z0000 Encounter for general adult medical examination without abnormal findings: Secondary | ICD-10-CM | POA: Diagnosis not present

## 2018-12-15 ENCOUNTER — Telehealth: Payer: Self-pay

## 2018-12-15 NOTE — Telephone Encounter (Signed)

## 2018-12-16 ENCOUNTER — Other Ambulatory Visit: Payer: Self-pay

## 2018-12-16 ENCOUNTER — Encounter: Payer: Self-pay | Admitting: Plastic Surgery

## 2018-12-16 ENCOUNTER — Ambulatory Visit: Payer: 59 | Admitting: Plastic Surgery

## 2018-12-16 VITALS — BP 188/105 | HR 58 | Temp 97.5°F | Ht 63.0 in | Wt 193.2 lb

## 2018-12-16 DIAGNOSIS — E65 Localized adiposity: Secondary | ICD-10-CM

## 2018-12-16 DIAGNOSIS — Z9884 Bariatric surgery status: Secondary | ICD-10-CM | POA: Diagnosis not present

## 2018-12-16 DIAGNOSIS — R238 Other skin changes: Secondary | ICD-10-CM

## 2018-12-16 NOTE — Progress Notes (Addendum)
Patient ID: Beverly Washington, female    DOB: 1966/02/17, 53 y.o.   MRN: [id number]   Chief Complaint  Patient presents with  . Skin Problem    The patient is a 53 year old white female here for evaluation of her abdominal area.  The patient is 5 feet 3 inches tall.  She weighs 193 pounds.  She lost 70 pounds after gastric bypass in 2018.  She had an abdominoplasty in the early 2000 by Dr. Towanda Malkin.  Her weight has been stable over the past year.  First from repeat infections in her pubic area.  She says that it gets red and irritated.  At times it even bleeds.  She is very uncomfortable with the fullness of the mons and pubic area.  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  She is content with her current weight.  The pictures show the excess pubic area is significant.  I do not feel any hernia.  There are no masses or tender areas in her exam.   Review of Systems  Constitutional: Positive for activity change. Negative for appetite change.  HENT: Negative for dental problem.   Eyes: Negative.   Respiratory: Negative for chest tightness and shortness of breath.   Cardiovascular: Negative for leg swelling.  Gastrointestinal: Negative for abdominal pain.  Endocrine: Negative.   Genitourinary: Negative.   Musculoskeletal: Positive for back pain.  Skin: Positive for rash and wound.  Neurological: Negative.   Hematological: Negative.   Psychiatric/Behavioral: Negative.     Past Medical History:  Diagnosis Date  . Complication of anesthesia 2014   low bp with epidural  . Coronary artery disease    Cardiac catheterization in February 2016 showed an 80% discrete mid LAD stenosis at the origin of first diagonal branch. The LAD was very small after the diagonal branch less than 2.5 mm. Ejection fraction was 65% with mildly elevated left ventricular end-diastolic pressure.  . Hyperlipidemia   . Hypertension   . Iritis    right eye  . Pre-diabetes     Past  Surgical History:  Procedure Laterality Date  . abdominal plasty    . BREAST REDUCTION SURGERY    . CARDIAC CATHETERIZATION  05/07/2014   [facility]  . CARDIAC CATHETERIZATION Left 08/05/2015   Procedure: Left Heart Cath and Coronary Angiography;  Surgeon: Wellington Hampshire, MD;  Location: [place] [facility];  Service: Cardiovascular;  Laterality: Left;  . CATARACT EXTRACTION W/ INTRAOCULAR LENS IMPLANT Right   . CESAREAN SECTION     x3  . CHOLECYSTECTOMY    . COLONOSCOPY WITH PROPOFOL N/A 03/21/2018   Procedure: COLONOSCOPY WITH PROPOFOL;  Surgeon: [name], MD;  Location: [place];  Service: Endoscopy;  Laterality: N/A;  Latex sensitivity  . COLONOSCOPY WITH PROPOFOL N/A 10/02/2018   Procedure: COLONOSCOPY WITH BIOPSIES;  Surgeon: [name], MD;  Location: [place];  Service: Endoscopy;  Laterality: N/A;  . EYE MUSCLE SURGERY    . HERNIA REPAIR    . LAPAROSCOPIC GASTRIC SLEEVE RESECTION N/A 03/19/2016   Procedure: LAPAROSCOPIC  LYSIS OF ADHESIONS, GASTRIC SLEEVE RESECTION, UPPER ENDO;  Surgeon: Johnathan Hausen, MD;  [facility];  Service: General;  Laterality: N/A;  . LAPAROSCOPIC LYSIS OF ADHESIONS  03/19/2016   Procedure: LAPAROSCOPIC LYSIS OF ADHESIONS;  Surgeon: Johnathan Hausen, MD;  Location[facility];  Service: General;;  . OOPHORECTOMY     right  . POLYPECTOMY  03/21/2018   Procedure: POLYPECTOMY;  Surgeon: [name], MD;  Location: [place];  Service: Endoscopy;;  . POLYPECTOMY N/A 10/02/2018   Procedure: POLYPECTOMY;  Surgeon: [name], MD;  Location: [place];  Service: Endoscopy;  Laterality: N/A;  . REDUCTION MAMMAPLASTY Bilateral 2000  . TOTAL ABDOMINAL HYSTERECTOMY    . UPPER GI ENDOSCOPY  03/19/2016   Procedure: UPPER GI ENDOSCOPY;  Surgeon: Johnathan Hausen, MD;  [facility];  Service: General;;      Current Outpatient Medications:  .  Cholecalciferol (VITAMIN D3) 1.25 MG (50000 UT) CAPS, TAKE 2 CAPSULES BY MOUTH  ONCE A WEEK ON SUNDAY, Disp: , Rfl:  .  Multiple Vitamin (MULTI-VITAMINS) TABS, Take 1 tablet by mouth daily. , Disp: , Rfl:    Objective:   Vitals:   12/16/18 0823  BP: (!) 188/105  Pulse: (!) 58  Temp: (!) 97.5 F (36.4 C)  SpO2: 100%    Physical Exam Vitals signs and nursing note reviewed.  Constitutional:      Appearance: Normal appearance.  HENT:     Head: Normocephalic and atraumatic.  Cardiovascular:     Rate and Rhythm: Normal rate.     Pulses: Normal pulses.  Pulmonary:     Effort: Pulmonary effort is normal.  Abdominal:     General: Abdomen is flat. There is no distension.     Palpations: There is no mass.     Tenderness: There is no abdominal tenderness. There is no guarding.    Skin:    General: Skin is warm.  Neurological:     General: No focal deficit present.     Mental Status: She is alert and oriented to person, place, and time.  Psychiatric:        Mood and Affect: Mood normal.        Behavior: Behavior normal.        Thought Content: Thought content normal.     Assessment & Plan:  S/P laparoscopic sleeve gastrectomy  Irritation symptom of skin  Panniculus adiposus  Patient is a candidate for revision of abdominal scar which is asymmetric.  As well as excision and liposuction of mons pubic area. Pictures were obtained of the patient and placed in the chart with the patient's or guardian's permission.   [place], DO

## 2018-12-26 ENCOUNTER — Encounter (HOSPITAL_COMMUNITY): Payer: Self-pay

## 2019-01-09 ENCOUNTER — Other Ambulatory Visit: Payer: Self-pay | Admitting: Internal Medicine

## 2019-01-09 DIAGNOSIS — Z1231 Encounter for screening mammogram for malignant neoplasm of breast: Secondary | ICD-10-CM

## 2019-02-10 ENCOUNTER — Other Ambulatory Visit: Payer: Self-pay

## 2019-02-10 ENCOUNTER — Encounter: Payer: Self-pay | Admitting: Surgical

## 2019-02-10 ENCOUNTER — Ambulatory Visit (INDEPENDENT_AMBULATORY_CARE_PROVIDER_SITE_OTHER): Payer: Self-pay | Admitting: Surgical

## 2019-02-10 ENCOUNTER — Telehealth: Payer: Self-pay | Admitting: Surgical

## 2019-02-10 VITALS — BP 155/99 | HR 65 | Temp 97.5°F | Ht 63.0 in | Wt 190.6 lb

## 2019-02-10 DIAGNOSIS — R238 Other skin changes: Secondary | ICD-10-CM

## 2019-02-10 DIAGNOSIS — Z719 Counseling, unspecified: Secondary | ICD-10-CM

## 2019-02-10 DIAGNOSIS — Z9884 Bariatric surgery status: Secondary | ICD-10-CM

## 2019-02-10 DIAGNOSIS — E65 Localized adiposity: Secondary | ICD-10-CM

## 2019-02-10 MED ORDER — ONDANSETRON HCL 4 MG PO TABS: 4.0000 mg | ORAL_TABLET | Freq: Three times a day (TID) | ORAL | 0 refills | Status: DC | PRN

## 2019-02-10 MED ORDER — CIPROFLOXACIN HCL 500 MG PO TABS: 500.0000 mg | ORAL_TABLET | Freq: Two times a day (BID) | ORAL | 0 refills | Status: DC

## 2019-02-10 MED ORDER — OXYCODONE HCL 5 MG PO TABS: 5.0000 mg | ORAL_TABLET | Freq: Four times a day (QID) | ORAL | 0 refills | Status: AC | PRN

## 2019-02-10 NOTE — Progress Notes (Addendum)
Patient ID: Beverly Washington, female    DOB: 1965-07-30, 53 y.o.   MRN: [id number]  Chief Complaint  Patient presents with  . Pre-op Exam    for excision of excess pubic skin with repair of abd scar      ICD-10-CM   1. Panniculus adiposus  E65   2. S/P laparoscopic sleeve gastrectomy  Z98.84   3. Irritation symptom of skin  R23.8    History of Present Illness: Beverly Washington is a 53 y.o.  female  with a history of multiple abdominal surgeries with scarring and significant weight loss after gastric bypass resulting is excess pubic skin and a large asymmetric abdominal scar s/p abdominoplasty many years ago.  She presents for preoperative evaluation for upcoming procedure, revision of abdominal asymmetric scarring, excision and liposuction of mons pubis, scheduled for 11.16.20 with Dr. Marla Roe at [facility].  The patient has not had problems with anesthesia. She reports no post-op issues with anesthesia and has tolerated procedures well in the past.  She has a hx of unstable angina, CAD, HTN - she reports these are stable and she has not had any issues recently. She never had stents placed due to blockages in her arteries being insignificant, per patient. She only had to take a blood thinner. She no longer is taking this.   She has a surgical hx of mammary reduction, gastric sleeve resection, cholecystectomy, abdominoplasty, hysterectomy. She also had 3 c-sections.  She has had 2 cardiac catheterizations - 2016 and 2017 - no stents placed.  No recent colds. No hx of dvt/pe. She does not smoke.  Mother with hx of PE.  Past Medical History: Allergies: Allergies  Allergen Reactions  . Ceftriaxone Rash    rocephin  . Hydromorphone Rash  . Latex Rash    Swelling, (powdered gloves when worn and surgical tape)  . Olmesartan Palpitations    Current Medications:  Current Outpatient Medications:  .  Cholecalciferol (VITAMIN D3) 1.25 MG (50000 UT) CAPS, TAKE 2 CAPSULES BY MOUTH ONCE  A WEEK ON SUNDAY, Disp: , Rfl:  .  Multiple Vitamin (MULTI-VITAMINS) TABS, Take 1 tablet by mouth daily. , Disp: , Rfl:   Past Medical Problems: Past Medical History:  Diagnosis Date  . Complication of anesthesia 2014   low bp with epidural  . Coronary artery disease    Cardiac catheterization in February 2016 showed an 80% discrete mid LAD stenosis at the origin of first diagonal branch. The LAD was very small after the diagonal branch less than 2.5 mm. Ejection fraction was 65% with mildly elevated left ventricular end-diastolic pressure.  . Hyperlipidemia   . Hypertension   . Iritis    right eye  . Pre-diabetes     Past Surgical History: Past Surgical History:  Procedure Laterality Date  . abdominal plasty    . BREAST REDUCTION SURGERY    . CARDIAC CATHETERIZATION  05/07/2014   [facility]  . CARDIAC CATHETERIZATION Left 08/05/2015   Procedure: Left Heart Cath and Coronary Angiography;  Surgeon: Wellington Hampshire, MD;  Location: [place] [facility];  Service: Cardiovascular;  Laterality: Left;  . CATARACT EXTRACTION W/ INTRAOCULAR LENS IMPLANT Right   . CESAREAN SECTION     x3  . CHOLECYSTECTOMY    . COLONOSCOPY WITH PROPOFOL N/A 03/21/2018   Procedure: COLONOSCOPY WITH PROPOFOL;  Surgeon: [name], MD;  Location: [place];  Service: Endoscopy;  Laterality: N/A;  Latex sensitivity  . COLONOSCOPY WITH PROPOFOL N/A 10/02/2018  Procedure: COLONOSCOPY WITH BIOPSIES;  Surgeon: [name], MD;  Location: [place];  Service: Endoscopy;  Laterality: N/A;  . EYE MUSCLE SURGERY    . HERNIA REPAIR    . LAPAROSCOPIC GASTRIC SLEEVE RESECTION N/A 03/19/2016   Procedure: LAPAROSCOPIC  LYSIS OF ADHESIONS, GASTRIC SLEEVE RESECTION, UPPER ENDO;  Surgeon: Johnathan Hausen, MD;  [facility];  Service: General;  Laterality: N/A;  . LAPAROSCOPIC LYSIS OF ADHESIONS  03/19/2016   Procedure: LAPAROSCOPIC LYSIS OF ADHESIONS;  Surgeon: Johnathan Hausen, MD;  [facility];   Service: General;;  . OOPHORECTOMY     right  . POLYPECTOMY  03/21/2018   Procedure: POLYPECTOMY;  Surgeon: [name], MD;  Location: [place];  Service: Endoscopy;;  . POLYPECTOMY N/A 10/02/2018   Procedure: POLYPECTOMY;  Surgeon: [name], MD;  Location: [place];  Service: Endoscopy;  Laterality: N/A;  . REDUCTION MAMMAPLASTY Bilateral 2000  . TOTAL ABDOMINAL HYSTERECTOMY    . UPPER GI ENDOSCOPY  03/19/2016   Procedure: UPPER GI ENDOSCOPY;  Surgeon: Johnathan Hausen, MD;  [facility];  Service: General;;    Social History: Social History   Socioeconomic History  . Marital status: Divorced    Spouse name: Not on file  . Number of children: Not on file  . Years of education: Not on file  . Highest education level: Not on file  Occupational History  . Not on file  Social Needs  . Financial resource strain: Not on file  . Food insecurity    Worry: Not on file    Inability: Not on file  . Transportation needs    Medical: Not on file    Non-medical: Not on file  Tobacco Use  . Smoking status: Never Smoker  . Smokeless tobacco: Never Used  Substance and Sexual Activity  . Alcohol use: No  . Drug use: No  . Sexual activity: Yes    Birth control/protection: Surgical  Lifestyle  . Physical activity    Days per week: Not on file    Minutes per session: Not on file  . Stress: Not on file  Relationships  . Social Herbalist on phone: Not on file    Gets together: Not on file    Attends religious service: Not on file    Active member of club or organization: Not on file    Attends meetings of clubs or organizations: Not on file    Relationship status: Not on file  . Intimate partner violence    Fear of current or ex partner: Not on file    Emotionally abused: Not on file    Physically abused: Not on file    Forced sexual activity: Not on file  Other Topics Concern  . Not on file  Social History Narrative  . Not on file     Family History: Family History  Problem Relation Age of Onset  . Heart disease Mother   . Heart attack Mother   . Pulmonary embolism Mother   . Breast cancer Mother 8  . Heart attack Maternal Uncle   . Heart disease Maternal Uncle   . Heart attack Paternal Uncle   . Heart attack Maternal Uncle   . Heart disease Maternal Uncle   . Heart attack Maternal Uncle   . Heart disease Maternal Uncle   . Heart attack Maternal Uncle   . Heart disease Maternal Uncle   . Heart attack Maternal Uncle   . Heart disease  Maternal Uncle   . Heart attack Maternal Uncle   . Heart disease Maternal Uncle     Review of Systems: Review of Systems  Constitutional: Positive for weight loss. Negative for chills, diaphoresis, fever and malaise/fatigue.  Respiratory: Negative for cough, shortness of breath and wheezing.   Cardiovascular: Negative for chest pain, palpitations, orthopnea, claudication, leg swelling and PND.  Gastrointestinal: Negative for diarrhea, nausea and vomiting.  Genitourinary: Negative for dysuria and urgency.  Musculoskeletal: Negative for myalgias.  Skin: Positive for rash. Negative for itching.  Neurological: Negative for dizziness, weakness and headaches.    Physical Exam: Vital Signs BP (!) 155/99 (BP Location: Left Arm, Patient Position: Sitting, Cuff Size: Large)   Pulse 65   Temp (!) 97.5 F (36.4 C) (Temporal)   Ht 5\' 3"  (1.6 m)   Wt 190 lb 9.6 oz (86.5 kg)   SpO2 100%   BMI 33.76 kg/m  Physical Exam Exam conducted with a chaperone present.  Constitutional:      General: She is not in acute distress.    Appearance: Normal appearance. She is not ill-appearing.  HENT:     Head: Normocephalic and atraumatic.  Eyes:     Pupils: Pupils are equal, round Neck:     Musculoskeletal: Normal range of motion.  Cardiovascular:     Rate and Rhythm: Normal rate and regular rhythm.     Pulses: Normal pulses.     Heart sounds: Normal heart sounds. No murmur.  Pulmonary:      Effort: Pulmonary effort is normal. No respiratory distress.     Breath sounds: Normal breath sounds. No wheezing.  Abdominal:     General: Abdomen is flat. There is no distension.     Palpations: Abdomen is soft.     Tenderness: There is no abdominal tenderness.     Transverse asymmetric scar noted with scarring divot noted left laterally.  Musculoskeletal: Normal range of motion.  Skin:    General: Skin is warm and dry.     Findings: No erythema or rash.  Neurological:     General: No focal deficit present.     Mental Status: She is alert and oriented to person, place, and time. Mental status is at baseline.     Motor: No weakness.  Psychiatric:        Mood and Affect: Mood normal.        Behavior: Behavior normal.    Assessment/Plan: The patient is scheduled for revision of abdominal asymmetric scarring, excision and liposuction of mons pubis, scheduled for 11.16.20 with Dr. Marla Roe at [facility].  Risks, benefits, and alternatives of procedure discussed, questions answered and verbal consent obtained. We thoroughly talked through intra-op, post-op complications and completely covered consent and patient verbally agreed that she understood the risks.  Rx sent to pharmacy. COVID scheduled Follow up scheduled. Can perform > 4 METS Caprini of 8, recommend mechanical ppx and possible lovenox day of surgical intervention with early ambulation.  Patient to obtain cardiac clearance by cardiology prior to surgical intervention due to hx. Addendum: Patient received cardiac clearance, form scanned to chart.  Patient to call with any questions or concerns prior to surgery.   Electronically signed by: Carola Rhine Daviana Haymaker, PA-C 02/10/2019 8:33 AM

## 2019-02-10 NOTE — Telephone Encounter (Signed)
Pt called to say that only 2 of her prescriptions were called in. The pharmacy had the antibiotic and the nausea meds, but no pain med. scheel

## 2019-02-12 ENCOUNTER — Ambulatory Visit
Admission: RE | Admit: 2019-02-12 | Discharge: 2019-02-12 | Disposition: A | Discharge status: Home / Self Care | Payer: 59 | Source: Ambulatory Visit | Attending: Internal Medicine | Admitting: Internal Medicine

## 2019-02-12 DIAGNOSIS — Z1231 Encounter for screening mammogram for malignant neoplasm of breast: Secondary | ICD-10-CM | POA: Insufficient documentation

## 2019-02-13 ENCOUNTER — Telehealth: Payer: Self-pay | Admitting: *Deleted

## 2019-02-13 NOTE — Telephone Encounter (Signed)
Received Surgical Clearance via fax back from [facility] stating that the patient is approved for Abdominal Surgery under local Anesthesia.//AB/CMA

## 2019-02-16 ENCOUNTER — Telehealth: Payer: Self-pay | Admitting: Plastic Surgery

## 2019-02-16 DIAGNOSIS — E881 Lipodystrophy, not elsewhere classified: Secondary | ICD-10-CM | POA: Diagnosis not present

## 2019-02-16 DIAGNOSIS — L905 Scar conditions and fibrosis of skin: Secondary | ICD-10-CM | POA: Diagnosis not present

## 2019-02-16 DIAGNOSIS — L918 Other hypertrophic disorders of the skin: Secondary | ICD-10-CM | POA: Diagnosis not present

## 2019-02-16 DIAGNOSIS — L987 Excessive and redundant skin and subcutaneous tissue: Secondary | ICD-10-CM | POA: Diagnosis not present

## 2019-02-16 DIAGNOSIS — Z9884 Bariatric surgery status: Secondary | ICD-10-CM | POA: Diagnosis not present

## 2019-02-16 DIAGNOSIS — Z411 Encounter for cosmetic surgery: Secondary | ICD-10-CM | POA: Diagnosis not present

## 2019-02-16 DIAGNOSIS — L308 Other specified dermatitis: Secondary | ICD-10-CM | POA: Diagnosis not present

## 2019-02-16 NOTE — Telephone Encounter (Signed)
Called to speak with patient and informed her to change dressings based on soil level.   She was feeling well other than post-op pain.

## 2019-02-16 NOTE — Telephone Encounter (Signed)
Pt wanted to know when she should change her dressings.

## 2019-02-23 ENCOUNTER — Other Ambulatory Visit (HOSPITAL_COMMUNITY): Payer: Self-pay | Admitting: Internal Medicine

## 2019-02-24 ENCOUNTER — Ambulatory Visit (INDEPENDENT_AMBULATORY_CARE_PROVIDER_SITE_OTHER): Payer: 59 | Admitting: Surgical

## 2019-02-24 ENCOUNTER — Other Ambulatory Visit: Payer: Self-pay

## 2019-02-24 ENCOUNTER — Encounter: Payer: Self-pay | Admitting: Surgical

## 2019-02-24 ENCOUNTER — Other Ambulatory Visit: Payer: Self-pay | Admitting: Surgical

## 2019-02-24 VITALS — BP 150/93 | HR 72 | Temp 96.6°F | Wt 190.2 lb

## 2019-02-24 DIAGNOSIS — R238 Other skin changes: Secondary | ICD-10-CM

## 2019-02-24 DIAGNOSIS — E65 Localized adiposity: Secondary | ICD-10-CM

## 2019-02-24 DIAGNOSIS — Z9884 Bariatric surgery status: Secondary | ICD-10-CM

## 2019-02-24 NOTE — Progress Notes (Signed)
Subjective:     Patient ID: Beverly Washington, female    DOB: [birth year], 53 y.o.   MRN: [id number]  Chief Complaint  Patient presents with  . Follow-up    HPI: The patient is a 53 y.o. female here for follow-up after revision of abdominal asymmetric scarring, excision and liposuction of mons pubis. She is 1 week post-op She reports that she had mostly been sleeping in a [place] and propped up, but reports some days she had been sleeping in her bed and reports a lot of drainage on those nights.  She also reports that she has not been wearing the binder at night.  She reports that overall she has been doing well.  She returned to work.  She has not had any fevers, chills, nausea, vomiting.  She reports that after taking Cipro she had a lot of itching and has been using Benadryl.  She reports that after stopping Cipro the itching stopped.  She reports that she took 1 dose of Linzess yesterday and had her first bowel movement today, reports she has been constipated since surgery.  Other than pain that is easily controlled on Norco and constipation she reports that she is doing well and has no complaints.  Review of Systems  Constitutional: Positive for activity change. Negative for appetite change, chills, diaphoresis, fatigue and fever.  Respiratory: Negative.   Cardiovascular: Negative.   Gastrointestinal: Positive for abdominal pain and constipation. Negative for diarrhea, nausea and vomiting.  Genitourinary: Negative for dysuria, hematuria and urgency.  Musculoskeletal: Negative.   Skin: Positive for color change (Bruising). Negative for pallor, rash and wound.  Neurological: Negative.      Objective:   Vital Signs BP (!) 150/93 (BP Location: Left Arm, Patient Position: Sitting, Cuff Size: Normal)   Pulse 72   Temp (!) 96.6 F (35.9 C) (Temporal)   Wt 190 lb 3.2 oz (86.3 kg)   SpO2 100%   BMI 33.69 kg/m  Vital Signs and Nursing Note Reviewed Chaperone present Physical  Exam  Constitutional: She is oriented to person, place, and time and well-developed, well-nourished, and in no distress.  HENT:  Head: Normocephalic and atraumatic.  Cardiovascular: Normal rate.  Pulmonary/Chest: Effort normal.  Abdominal: Soft. She exhibits no distension. There is abdominal tenderness.    Suprapubic soft tissue swelling, ecchymosis noted along inner left thigh, right lateral thigh.  Transverse incision is healing well, external Monocryl sutures in place, no dehiscence noted, no drainage noted.  Musculoskeletal: Normal range of motion.  Neurological: She is alert and oriented to person, place, and time. Gait normal.  Skin: Skin is warm and dry. No rash noted. She is not diaphoretic. No erythema. No pallor.  Psychiatric: Mood and affect normal.      Assessment/Plan:     ICD-10-CM   1. Panniculus adiposus  E65   2. S/P laparoscopic sleeve gastrectomy  Z98.84   3. Irritation symptom of skin  R23.8    Overall [name] is doing really well.  Her incision is healing well, no dehiscence noted. No sign of infection.  Her suprapubic area is swollen, likely from liposuction and postop swelling, continue with abdominal binder.  Recommend wearing abdominal binder for 6 weeks 24/7. Start using MiraLAX to assist with bowel movements.  She does not think she is going to continue using Linzess  Drink plenty of fluids.  Multivitamin and vitamin C for optimal healing.  Call with questions or concerns, follow-up in 3 weeks for reevaluation, removal of sutures.  Carola Rhine Amra Shukla, PA-C 02/24/2019, 8:20 AM

## 2019-03-04 ENCOUNTER — Ambulatory Visit (INDEPENDENT_AMBULATORY_CARE_PROVIDER_SITE_OTHER): Payer: 59 | Admitting: Surgical

## 2019-03-04 ENCOUNTER — Encounter: Payer: Self-pay | Admitting: Surgical

## 2019-03-04 VITALS — BP 131/90 | HR 76 | Temp 97.1°F | Ht 63.0 in | Wt 191.6 lb

## 2019-03-04 DIAGNOSIS — E65 Localized adiposity: Secondary | ICD-10-CM

## 2019-03-04 DIAGNOSIS — Z9884 Bariatric surgery status: Secondary | ICD-10-CM

## 2019-03-04 DIAGNOSIS — R238 Other skin changes: Secondary | ICD-10-CM

## 2019-03-04 NOTE — Progress Notes (Signed)
   Subjective:     Patient ID: Beverly Washington, female    DOB: [birth year], 53 y.o.   MRN: [id number]  Chief Complaint  Patient presents with  . Follow-up    HPI: The patient is a 53 y.o. female here for follow-up after revision of abdominal asymmetric scarring, excision and liposuction of mons pubis.  She was last seen on 02/24/19 and was doing well overall. She had no issues at that time.   She reports since her last visit, she has been wearing the binder and sleeping in a slightly flexed position. She has had no issues until yesterday. She noticed a fluid sloshing sound and then had a large amount of serosanguinous drainage yesterday. She reports putting dermabond on the midline opening yesterday and it held throughout the night. Today it started to drain again while at work.  She has been wearing her abdominal binder daily and nightly. She has no fevers, chills, n/v. Pain is well controlled. No other complaints  On exam, she had serosanguinous drainage on her pants and on a chuck pad. No sign of infection.    Review of Systems  Constitutional: Positive for activity change. Negative for appetite change, chills, diaphoresis, fatigue and fever.  Respiratory: Negative.   Cardiovascular: Negative.   Gastrointestinal: Negative for abdominal distention, abdominal pain, diarrhea, nausea and vomiting.  Musculoskeletal: Negative.   Skin: Positive for wound. Negative for color change, pallor and rash.  Neurological: Negative.      Objective:   Vital Signs BP 131/90 (BP Location: Left Arm, Patient Position: Sitting, Cuff Size: Large)   Pulse 76   Temp (!) 97.1 F (36.2 C) (Temporal)   Ht 5\' 3"  (1.6 m)   Wt 191 lb 9.6 oz (86.9 kg)   SpO2 99%   BMI 33.94 kg/m  Vital Signs and Nursing Note Reviewed Chaperone present Physical Exam  Constitutional: She is oriented to person, place, and time and well-developed, well-nourished, and in no distress.  HENT:  Head: Normocephalic and  atraumatic.  Cardiovascular: Normal rate.  Pulmonary/Chest: Effort normal.  Abdominal: Soft. She exhibits no distension and no mass. There is no abdominal tenderness. There is no rebound and no guarding.    No erythema noted.  Musculoskeletal: Normal range of motion.  Neurological: She is alert and oriented to person, place, and time. Gait normal.  Skin: Skin is warm and dry. No rash noted. She is not diaphoretic. No erythema. No pallor.  Psychiatric: Mood and affect normal.    Assessment/Plan:     ICD-10-CM   1. Panniculus adiposus  E65   2. S/P laparoscopic sleeve gastrectomy  Z98.84   3. Irritation symptom of skin  R23.8     Beverly Washington is doing well. No concern for infection at this time. Her incision is overall healing very well. Small opening midline, draining serosanguinous fluid.    Dressing applied, multiple ABD pads. Recommend using maxipad, ABD pads for drainage. Change PRN. Call with questions or concerns.   Continue wearing abdominal binder 24/7, can change to wash it. Spanx may be helpful as well. Sleeping in flexed position will also benefit patient.  Plan for f/u on the 15th of December. Call with questions or concerns prior.  Pictures were obtained of the patient and placed in the chart with the patient's or guardian's permission.   Beverly Washington Beverly Stelly, PA-C 03/04/2019, 11:30 AM

## 2019-03-05 ENCOUNTER — Encounter: Payer: 59 | Admitting: Surgical

## 2019-03-17 ENCOUNTER — Ambulatory Visit (INDEPENDENT_AMBULATORY_CARE_PROVIDER_SITE_OTHER): Payer: 59 | Admitting: Surgical

## 2019-03-17 ENCOUNTER — Other Ambulatory Visit: Payer: Self-pay

## 2019-03-17 ENCOUNTER — Encounter: Payer: Self-pay | Admitting: Surgical

## 2019-03-17 VITALS — BP 154/90 | HR 59 | Temp 97.1°F | Ht 63.0 in | Wt 194.4 lb

## 2019-03-17 DIAGNOSIS — E65 Localized adiposity: Secondary | ICD-10-CM

## 2019-03-17 NOTE — Progress Notes (Signed)
Subjective:     Patient ID: Beverly Washington, female    DOB: [birth year], 53 y.o.   MRN: [id number]  Chief Complaint  Patient presents with  . Follow-up    3 weeks for excision of excess pubic skin w/repair of abd scar    HPI: The patient is a 53 y.o. female here for follow-up after revision of asymmetric abdominal scarring, excision and liposuction of mons pubis.  Last seen 03/04/19 and overall, was doing well. She did have a large amount of serous drainage that occurred, but it did not appear infected or concerning at that time.  Today she reports that she had another episode of drainage that occurred a few days ago.  She reports the drainage was a little bit darker in color than previously, more sanguinous than serous.  Based on description it seems to be serosanguineous in nature.  No active bleeding.  She is feeling well, no dizziness, no headaches, no vision changes.  She reports she has been wearing the abdominal binder and some spanks most of the time, the night before the drainage occurred she was not wearing the binder.  On exam her incision is really well healed, she reports the drainage has been occurring midline as it was previously, but there is no sign of an opening at this time.  There is no surrounding erythema.  No sign of infection.  Review of Systems  Constitutional: Positive for activity change. Negative for appetite change, chills, diaphoresis, fatigue and fever.  Respiratory: Negative for shortness of breath.   Cardiovascular: Negative for palpitations and leg swelling.  Gastrointestinal: Positive for abdominal pain. Negative for abdominal distention, nausea and vomiting.  Skin: Negative for color change, pallor, rash and wound.  Neurological: Negative for dizziness, syncope, weakness, light-headedness and headaches.     Objective:   Vital Signs BP (!) 154/90 (BP Location: Left Arm, Patient Position: Sitting, Cuff Size: Normal)   Pulse (!) 59   Temp (!) 97.1  F (36.2 C) (Temporal)   Ht 5\' 3"  (1.6 m)   Wt 194 lb 6.4 oz (88.2 kg)   SpO2 100%   BMI 34.44 kg/m  Vital Signs and Nursing Note Reviewed Chaperone present Physical Exam  Constitutional: She is oriented to person, place, and time and well-developed, well-nourished, and in no distress.  HENT:  Head: Normocephalic and atraumatic.  Cardiovascular: Normal rate.  Pulmonary/Chest: Effort normal.  Abdominal: Soft. She exhibits no shifting dullness.    Musculoskeletal:        General: Normal range of motion.  Neurological: She is alert and oriented to person, place, and time. Gait normal.  Skin: Skin is warm and dry. No rash noted. She is not diaphoretic. No erythema. No pallor.  Psychiatric: Mood and affect normal.    Assessment/Plan:     ICD-10-CM   1. Panniculus adiposus  E65    Overall Beverly Washington is doing well.  Her incision is healing really well.  There is no sign of infection.  She is very happy with her outcome and healing.  Recommend continuing to wear abdominal binder for 2 weeks full-time.  Due to the large amount of drainage she has had, recommend wearing abdominal binder spanks until follow-up in 1 month.  She continues to have drainage that occurs intermittently.  Wearing compressive binder or spanks, this should assist with decreasing fluid collection and spacing within the area between adipose tissue and abdominal fascia.  Recommend healthy eating.   Call with questions or concerns.  Follow-up in 1 month.  Beverly Rhine Brayten Komar, PA-C 03/17/2019, 8:21 AM

## 2019-04-15 ENCOUNTER — Ambulatory Visit: Payer: 59 | Admitting: Surgical

## 2019-04-17 ENCOUNTER — Encounter (INDEPENDENT_AMBULATORY_CARE_PROVIDER_SITE_OTHER): Payer: Self-pay

## 2019-06-03 DIAGNOSIS — S9781XA Crushing injury of right foot, initial encounter: Secondary | ICD-10-CM | POA: Diagnosis not present

## 2019-06-22 ENCOUNTER — Encounter: Payer: Self-pay | Admitting: Certified Nurse Midwife

## 2019-10-16 ENCOUNTER — Encounter (HOSPITAL_COMMUNITY): Payer: Self-pay

## 2020-01-08 ENCOUNTER — Other Ambulatory Visit: Payer: Self-pay | Admitting: *Deleted

## 2020-01-08 DIAGNOSIS — Z1231 Encounter for screening mammogram for malignant neoplasm of breast: Secondary | ICD-10-CM

## 2020-01-16 ENCOUNTER — Ambulatory Visit: Payer: 59 | Attending: Internal Medicine

## 2020-01-16 DIAGNOSIS — Z23 Encounter for immunization: Secondary | ICD-10-CM

## 2020-01-16 NOTE — Progress Notes (Signed)
   Covid-19 Vaccination Clinic  Name:  Beverly Washington    MRN: [id number] DOB: January 19, 1966  01/16/2020  Beverly Washington was observed post Covid-19 immunization for 15 minutes without incident. She was provided with Vaccine Information Sheet and instruction to access the V-Safe system.   Beverly Washington was instructed to call 911 with any severe reactions post vaccine: Marland Kitchen Difficulty breathing  . Swelling of face and throat  . A fast heartbeat  . A bad rash all over body  . Dizziness and weakness

## 2020-03-14 ENCOUNTER — Other Ambulatory Visit: Payer: Self-pay

## 2020-03-14 ENCOUNTER — Ambulatory Visit
Admission: RE | Admit: 2020-03-14 | Discharge: 2020-03-14 | Disposition: A | Discharge status: Home / Self Care | Payer: 59 | Source: Ambulatory Visit | Attending: Internal Medicine | Admitting: Internal Medicine

## 2020-03-14 DIAGNOSIS — Z1231 Encounter for screening mammogram for malignant neoplasm of breast: Secondary | ICD-10-CM | POA: Diagnosis not present

## 2020-06-28 ENCOUNTER — Other Ambulatory Visit: Payer: Self-pay

## 2020-06-28 MED ORDER — METRONIDAZOLE 500 MG PO TABS: 500.0000 mg | ORAL_TABLET | Freq: Three times a day (TID) | ORAL | 0 refills | Status: DC

## 2020-07-05 DIAGNOSIS — G4700 Insomnia, unspecified: Secondary | ICD-10-CM | POA: Insufficient documentation

## 2020-07-05 DIAGNOSIS — N83209 Unspecified ovarian cyst, unspecified side: Secondary | ICD-10-CM | POA: Insufficient documentation

## 2020-07-05 DIAGNOSIS — H209 Unspecified iridocyclitis: Secondary | ICD-10-CM | POA: Insufficient documentation

## 2020-07-05 DIAGNOSIS — H02409 Unspecified ptosis of unspecified eyelid: Secondary | ICD-10-CM | POA: Insufficient documentation

## 2020-07-05 DIAGNOSIS — T8579XA Infection and inflammatory reaction due to other internal prosthetic devices, implants and grafts, initial encounter: Secondary | ICD-10-CM | POA: Insufficient documentation

## 2020-07-05 DIAGNOSIS — K432 Incisional hernia without obstruction or gangrene: Secondary | ICD-10-CM | POA: Insufficient documentation

## 2020-07-05 DIAGNOSIS — E669 Obesity, unspecified: Secondary | ICD-10-CM | POA: Insufficient documentation

## 2020-07-06 ENCOUNTER — Ambulatory Visit (INDEPENDENT_AMBULATORY_CARE_PROVIDER_SITE_OTHER): Payer: 59

## 2020-07-06 ENCOUNTER — Other Ambulatory Visit: Payer: Self-pay

## 2020-07-06 ENCOUNTER — Encounter: Payer: Self-pay | Admitting: Podiatry

## 2020-07-06 ENCOUNTER — Ambulatory Visit: Payer: 59 | Admitting: Podiatry

## 2020-07-06 DIAGNOSIS — M6740 Ganglion, unspecified site: Secondary | ICD-10-CM

## 2020-07-06 NOTE — Progress Notes (Signed)
  Subjective:  Patient ID: Beverly Washington, female    DOB: 03-Jan-1966,  MRN: [id number]  Chief Complaint  Patient presents with  . Foot Pain    Patient presents today for painful cyst on top of left hallux x 2-3 months.  She says it throbs after standing on foot for an hour or two    55 y.o. female presents with the above complaint. History confirmed with patient.  This is been bothersome for some time.  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 Left Foot: Soft palpable mobile mass on the dorsal IPJ of the hallux, no neuritic symptoms on palpation and percussion Radiographs: X-ray of the left foot: No osseous appreciable abnormality to explain the mass Assessment:   1. Ganglion cyst      Plan:  Patient was evaluated and treated and all questions answered.  Suspect this is a ganglion cyst.  I discussed etiology and treatment options for her including nonsurgical treatment, injection aspiration and surgical excision.  She prefers surgical excision which I think is probably the best route for prevention of recurrence.  Discussed the risk, benefits and potential complications and the postoperative course.  All questions were addressed.  Informed consent was signed and reviewed.   Surgical plan:  Procedure: -Excision of ganglion cyst left hallux  Location: -[facility]  Anesthesia plan: -IV sedation with local anesthesia  Postoperative pain plan: - Tylenol 1000 mg every 6 hours, ibuprofen 600 mg every 6 hours, gabapentin 300 mg every 8 hours x5 days, oxycodone 5 mg 1-2 tabs every 6 hours only as needed  DVT prophylaxis: -None required  WB Restrictions / DME needs: -WBAT in surgical shoe she has this and will bring to surgical center   No follow-ups on file.

## 2020-07-11 ENCOUNTER — Telehealth: Payer: Self-pay | Admitting: Urology

## 2020-07-11 NOTE — Telephone Encounter (Signed)
DOS - 07/29/20  EXC MASS 1ST LEFT TOE --- 03524  [facility] EFFECTIVE DATE - 04/02/20  PLAN DEDUCTIBLE - $300 W/ $300 REMAINING OUT OF POCKET - $7900.00 W/ $7,895.00 REMAINING COINSURANCE - 40% COPAY - $0.00   SPOKE WITH EDWARDO WITH [facility] AND HE STATED THAT NO PRIOR AUTH IS REQUIRED FOR CPT CODE 81859. REF # [id number]

## 2020-07-18 ENCOUNTER — Other Ambulatory Visit: Payer: Self-pay

## 2020-07-28 ENCOUNTER — Other Ambulatory Visit: Payer: Self-pay | Admitting: Podiatry

## 2020-07-28 ENCOUNTER — Other Ambulatory Visit: Payer: Self-pay

## 2020-07-28 MED ORDER — OXYCODONE HCL 5 MG PO TABS
5.0000 mg | ORAL_TABLET | ORAL | 0 refills | Status: DC | PRN
  Filled 2020-07-28: qty 20, 4d supply, fill #0

## 2020-07-28 MED ORDER — GABAPENTIN 300 MG PO CAPS
300.0000 mg | ORAL_CAPSULE | Freq: Three times a day (TID) | ORAL | 0 refills | Status: DC
  Filled 2020-07-28: qty 21, 7d supply, fill #0

## 2020-07-28 MED ORDER — ACETAMINOPHEN 500 MG PO TABS
1000.0000 mg | ORAL_TABLET | Freq: Four times a day (QID) | ORAL | 0 refills | Status: AC | PRN
  Filled 2020-07-28: qty 112, 14d supply, fill #0

## 2020-07-28 MED ORDER — IBUPROFEN 600 MG PO TABS
600.0000 mg | ORAL_TABLET | Freq: Four times a day (QID) | ORAL | 0 refills | Status: AC | PRN
  Filled 2020-07-28: qty 56, 14d supply, fill #0

## 2020-07-28 NOTE — Progress Notes (Signed)
Postop medication sent to pharmacy for surgery 07/29/2020

## 2020-07-29 DIAGNOSIS — D481 Neoplasm of uncertain behavior of connective and other soft tissue: Secondary | ICD-10-CM | POA: Diagnosis not present

## 2020-07-29 DIAGNOSIS — L729 Follicular cyst of the skin and subcutaneous tissue, unspecified: Secondary | ICD-10-CM | POA: Diagnosis not present

## 2020-08-01 ENCOUNTER — Encounter: Payer: Self-pay | Admitting: Internal Medicine

## 2020-08-03 ENCOUNTER — Encounter: Payer: Self-pay | Admitting: Podiatry

## 2020-08-03 ENCOUNTER — Ambulatory Visit (INDEPENDENT_AMBULATORY_CARE_PROVIDER_SITE_OTHER): Payer: 59 | Admitting: Podiatry

## 2020-08-03 ENCOUNTER — Other Ambulatory Visit: Payer: Self-pay

## 2020-08-03 ENCOUNTER — Encounter: Payer: 59 | Admitting: Podiatry

## 2020-08-03 DIAGNOSIS — M7989 Other specified soft tissue disorders: Secondary | ICD-10-CM

## 2020-08-03 MED ORDER — GABAPENTIN 300 MG PO CAPS
300.0000 mg | ORAL_CAPSULE | Freq: Three times a day (TID) | ORAL | 0 refills | Status: DC
  Filled 2020-08-03: qty 21, 7d supply, fill #0

## 2020-08-03 MED ORDER — OXYCODONE HCL 5 MG PO TABS
5.0000 mg | ORAL_TABLET | Freq: Four times a day (QID) | ORAL | 0 refills | Status: AC | PRN
  Filled 2020-08-03: qty 20, 5d supply, fill #0

## 2020-08-03 NOTE — Progress Notes (Signed)
  Subjective:  Patient ID: Beverly Washington, female    DOB: June 26, 1965,  MRN: [id number]  Chief Complaint  Patient presents with  . Routine Post Op    POV #1 DOS 07/29/2020 EXCISION OF MSS LT GREAT TOE    DOS: 07/29/2020 Procedure: Excision of mass left great toe  55 y.o. female returns for post-op check.  Overall doing well she is having some pain and is kept her up at night  Review of Systems: Negative except as noted in the HPI. Denies N/V/F/Ch.   Objective:  There were no vitals filed for this visit. There is no height or weight on file to calculate BMI. Constitutional Well developed. Well nourished.  Vascular Foot warm and well perfused. Capillary refill normal to all digits.   Neurologic Normal speech. Oriented to person, place, and time. Epicritic sensation to light touch grossly present bilaterally.  Dermatologic Skin healing well without signs of infection. Skin edges well coapted without signs of infection.  Orthopedic: Tenderness to palpation noted about the surgical site.   Assessment:   1. Mass of soft tissue of foot    Plan:  Patient was evaluated and treated and all questions answered.  S/p foot surgery left -Progressing as expected post-operatively. -WB Status: WBAT in surgical shoe -Sutures: We will remove next week. -Medications: Refill gabapentin oxycodone -Foot redressed.  She may begin bathing and will reapply clean dressing  Return in about 1 week (around 08/10/2020) for suture removal .

## 2020-08-04 ENCOUNTER — Encounter: Payer: 59 | Admitting: Podiatry

## 2020-08-09 ENCOUNTER — Telehealth: Payer: Self-pay | Admitting: Podiatry

## 2020-08-09 NOTE — Telephone Encounter (Signed)
Called patient to notify her that Dr. Sherryle Lis will be out of the office this week. She stated that she will have her PCP remove her sutures and she will come in for her next POV on 08/31/2020

## 2020-08-10 ENCOUNTER — Encounter: Payer: 59 | Admitting: Podiatry

## 2020-08-11 ENCOUNTER — Encounter: Payer: 59 | Admitting: Podiatry

## 2020-08-16 ENCOUNTER — Encounter: Payer: Self-pay | Admitting: Podiatry

## 2020-08-24 ENCOUNTER — Encounter: Payer: 59 | Admitting: Obstetrics and Gynecology

## 2020-08-30 ENCOUNTER — Encounter: Payer: 59 | Admitting: Podiatry

## 2020-08-30 ENCOUNTER — Other Ambulatory Visit: Payer: Self-pay

## 2020-08-30 ENCOUNTER — Telehealth: Payer: Self-pay

## 2020-08-30 MED ORDER — CEPHALEXIN 500 MG PO CAPS
500.0000 mg | ORAL_CAPSULE | Freq: Three times a day (TID) | ORAL | 0 refills | Status: AC
  Filled 2020-08-30: qty 15, 5d supply, fill #0

## 2020-08-30 NOTE — Telephone Encounter (Signed)
Spoke with her and sent Rx for keflex, will see her at appt tomorrow

## 2020-08-30 NOTE — Telephone Encounter (Signed)
Pt called in and stated that the she called that she called yesterday around 4:30 PM and spoke with the on call nurse to let them know that her surgical site busted open but she never got a call back from the on call provider. She stated that she was super sore before but after a large amount of blood was released from it she is no longer in pain and she has closed the site with steri-strips. The pt would like a call back to discuss what took place over the the weekend and find out if an antibiotic is needed.   [facility] pharmacy

## 2020-08-31 ENCOUNTER — Ambulatory Visit (INDEPENDENT_AMBULATORY_CARE_PROVIDER_SITE_OTHER): Payer: 59 | Admitting: Podiatry

## 2020-08-31 ENCOUNTER — Other Ambulatory Visit: Payer: Self-pay

## 2020-08-31 DIAGNOSIS — Z803 Family history of malignant neoplasm of breast: Secondary | ICD-10-CM

## 2020-08-31 DIAGNOSIS — M7989 Other specified soft tissue disorders: Secondary | ICD-10-CM

## 2020-08-31 DIAGNOSIS — Z1371 Encounter for nonprocreative screening for genetic disease carrier status: Secondary | ICD-10-CM | POA: Insufficient documentation

## 2020-08-31 HISTORY — DX: Encounter for nonprocreative screening for genetic disease carrier status: Z13.71

## 2020-08-31 HISTORY — DX: Family history of malignant neoplasm of breast: Z80.3

## 2020-09-01 ENCOUNTER — Other Ambulatory Visit: Payer: Self-pay

## 2020-09-01 ENCOUNTER — Encounter: Payer: Self-pay | Admitting: Podiatry

## 2020-09-01 MED ORDER — MUPIROCIN 2 % EX OINT
1.0000 "application " | TOPICAL_OINTMENT | Freq: Every day | CUTANEOUS | 2 refills | Status: DC
  Filled 2020-09-01: qty 22, 22d supply, fill #0

## 2020-09-01 NOTE — Progress Notes (Signed)
  Subjective:  Patient ID: Beverly Washington, female    DOB: 01-01-1966,  MRN: [id number]  Chief Complaint  Patient presents with  . Routine Post Op      POV #3 DOS 07/29/2020 EXCISION OF MSS LT GREAT TOE    DOS: 07/29/2020 Procedure: Excision of mass left great toe  55 y.o. female returns for post-op check.  She said her incision popped open and bled quite a bit over the weekend.  She applied a Steri-Strip.  She called the on-call doctor but did not return call  Review of Systems: Negative except as noted in the HPI. Denies N/V/F/Ch.   Objective:  There were no vitals filed for this visit. There is no height or weight on file to calculate BMI. Constitutional Well developed. Well nourished.  Vascular Foot warm and well perfused. Capillary refill normal to all digits.   Neurologic Normal speech. Oriented to person, place, and time. Epicritic sensation to light touch grossly present bilaterally.  Dermatologic  distal portion of incision is well-healed and well coapted, proximal portion has a small superficial dehiscence approximately 1 cm in length  Orthopedic: Tenderness to palpation noted about the surgical site.   Assessment:   No diagnosis found. Plan:  Patient was evaluated and treated and all questions answered.  S/p foot surgery left -Overall doing better considering the issue she had over the weekend.  I think she had a small hematoma that expressed itself and cause disruption of the incision.  Appears to be healing well now.  I think she should apply mupirocin ointment daily and a small bandage and continue Steri-Strips.  She will finish the Keflex. -We will see her back in a few weeks for reevaluation to ensure that it is fully healed -Pathology results as benign smooth-walled cyst  No follow-ups on file.

## 2020-09-02 ENCOUNTER — Other Ambulatory Visit: Payer: Self-pay

## 2020-09-07 ENCOUNTER — Other Ambulatory Visit (HOSPITAL_COMMUNITY)
Admission: RE | Admit: 2020-09-07 | Discharge: 2020-09-07 | Disposition: A | Discharge status: Home / Self Care | Payer: 59 | Source: Ambulatory Visit | Attending: Obstetrics and Gynecology | Admitting: Obstetrics and Gynecology

## 2020-09-07 ENCOUNTER — Ambulatory Visit (INDEPENDENT_AMBULATORY_CARE_PROVIDER_SITE_OTHER): Payer: 59 | Admitting: Obstetrics and Gynecology

## 2020-09-07 ENCOUNTER — Other Ambulatory Visit: Payer: Self-pay

## 2020-09-07 ENCOUNTER — Encounter: Payer: Self-pay | Admitting: Obstetrics and Gynecology

## 2020-09-07 VITALS — BP 130/80 | Ht 63.0 in | Wt 190.0 lb

## 2020-09-07 DIAGNOSIS — Z1151 Encounter for screening for human papillomavirus (HPV): Secondary | ICD-10-CM | POA: Insufficient documentation

## 2020-09-07 DIAGNOSIS — Z803 Family history of malignant neoplasm of breast: Secondary | ICD-10-CM

## 2020-09-07 DIAGNOSIS — Z124 Encounter for screening for malignant neoplasm of cervix: Secondary | ICD-10-CM | POA: Diagnosis not present

## 2020-09-07 DIAGNOSIS — Z1231 Encounter for screening mammogram for malignant neoplasm of breast: Secondary | ICD-10-CM | POA: Diagnosis not present

## 2020-09-07 DIAGNOSIS — Z86010 Personal history of colonic polyps: Secondary | ICD-10-CM | POA: Diagnosis not present

## 2020-09-07 DIAGNOSIS — Z01419 Encounter for gynecological examination (general) (routine) without abnormal findings: Secondary | ICD-10-CM

## 2020-09-07 DIAGNOSIS — Z8371 Family history of colonic polyps: Secondary | ICD-10-CM | POA: Diagnosis not present

## 2020-09-07 DIAGNOSIS — Z800 Family history of malignant neoplasm of digestive organs: Secondary | ICD-10-CM | POA: Diagnosis not present

## 2020-09-07 NOTE — Progress Notes (Addendum)
PCP: [name], MD   Chief Complaint  Patient presents with  . Gynecologic Exam    No concerns    HPI:      [name] is a 55 y.o. G3P3 whose LMP was No LMP recorded. Patient has had a hysterectomy., presents today for her NP> 3 yrs annual examination.  Her menses are absent due to hyst for AUB/mass. She denies PMB, pain. She does not have vasomotor sx.   Sex activity: not sexually active. She does not have vaginal dryness.  Last Pap: 09/25/16  Results were: unsatisfactory /neg HPV DNA. F/u pap due today.   Hx of STDs: trichomonas  Last mammogram: 03/14/20 with PCP;  Results were: normal--routine follow-up in 12 months There is a FH of breast cancer in her mother, genetic testing not done. There is no FH of ovarian cancer. There is a strong hx of colon cancer and colon polyps on pat side. The patient does do self-breast exams.  Colonoscopy: 7/20 with [name] with polyps;  Repeat due after 3 years.   Tobacco use: The patient denies current or previous tobacco use. Alcohol use: none  No drug use Exercise: moderately active  She does get adequate calcium and Vitamin D in her diet.  Labs with PCP.   Past Medical History:  Diagnosis Date  . Colon polyps   . Complication of anesthesia 2014   low bp with epidural  . Coronary artery disease    Cardiac catheterization in February 2016 showed an 80% discrete mid LAD stenosis at the origin of first diagonal branch. The LAD was very small after the diagonal branch less than 2.5 mm. Ejection fraction was 65% with mildly elevated left ventricular end-diastolic pressure.  . Hyperlipidemia   . Hypertension   . Iritis    right eye  . Pre-diabetes     Past Surgical History:  Procedure Laterality Date  . abdominal plasty    . BREAST REDUCTION SURGERY    . CARDIAC CATHETERIZATION  05/07/2014   [facility]  . CARDIAC CATHETERIZATION Left 08/05/2015   Procedure: Left Heart Cath and Coronary Angiography;  Surgeon: Wellington Hampshire, MD;  Location: [facility];  Service: Cardiovascular;  Laterality: Left;  . CATARACT EXTRACTION W/ INTRAOCULAR LENS IMPLANT Right   . CESAREAN SECTION     x3  . CHOLECYSTECTOMY    . COLONOSCOPY WITH PROPOFOL N/A 03/21/2018   Procedure: COLONOSCOPY WITH PROPOFOL;  Surgeon: [name], MD;  Location: [place];  Service: Endoscopy;  Laterality: N/A;  Latex sensitivity  . COLONOSCOPY WITH PROPOFOL N/A 10/02/2018   Procedure: COLONOSCOPY WITH BIOPSIES;  Surgeon: [name], MD;  Location: [place];  Service: Endoscopy;  Laterality: N/A;  . EYE MUSCLE SURGERY    . HERNIA REPAIR    . LAPAROSCOPIC GASTRIC SLEEVE RESECTION N/A 03/19/2016   Procedure: LAPAROSCOPIC  LYSIS OF ADHESIONS, GASTRIC SLEEVE RESECTION, UPPER ENDO;  Surgeon: Johnathan Hausen, MD;  Location: [facility];  Service: General;  Laterality: N/A;  . LAPAROSCOPIC LYSIS OF ADHESIONS  03/19/2016   Procedure: LAPAROSCOPIC LYSIS OF ADHESIONS;  Surgeon: Johnathan Hausen, MD;  Location: [facility];  Service: General;;  . OOPHORECTOMY     right  . POLYPECTOMY  03/21/2018   Procedure: POLYPECTOMY;  Surgeon: [name], MD;  Location: [place];  Service: Endoscopy;;  . POLYPECTOMY N/A 10/02/2018   Procedure: POLYPECTOMY;  Surgeon: [name], MD;  Location: [place];  Service: Endoscopy;  Laterality: N/A;  .  REDUCTION MAMMAPLASTY Bilateral 2000  . TOTAL ABDOMINAL HYSTERECTOMY    . UPPER GI ENDOSCOPY  03/19/2016   Procedure: UPPER GI ENDOSCOPY;  Surgeon: Johnathan Hausen, MD;  Location: [facility];  Service: General;;    Family History  Problem Relation Age of Onset  . Heart disease Mother   . Heart attack Mother   . Pulmonary embolism Mother   . Breast cancer Mother 33  . Colon polyps Father   . Heart attack Maternal Uncle   . Heart disease Maternal Uncle   . Heart attack Paternal Uncle   . Heart attack Maternal Uncle   . Heart disease Maternal Uncle   . Heart attack Maternal Uncle   .  Heart disease Maternal Uncle   . Heart attack Maternal Uncle   . Heart disease Maternal Uncle   . Heart attack Maternal Uncle   . Heart disease Maternal Uncle   . Heart attack Maternal Uncle   . Heart disease Maternal Uncle   . Colon cancer Paternal Uncle 19  . Colon cancer Cousin 32    Social History   Socioeconomic History  . Marital status: Divorced    Spouse name: Not on file  . Number of children: Not on file  . Years of education: Not on file  . Highest education level: Not on file  Occupational History  . Not on file  Tobacco Use  . Smoking status: Never Smoker  . Smokeless tobacco: Never Used  Vaping Use  . Vaping Use: Never used  Substance and Sexual Activity  . Alcohol use: No  . Drug use: No  . Sexual activity: Not Currently    Birth control/protection: Surgical    Comment: Hysterectomy  Other Topics Concern  . Not on file  Social History Narrative  . Not on file   Social Determinants of Health   Financial Resource Strain: Not on file  Food Insecurity: Not on file  Transportation Needs: Not on file  Physical Activity: Not on file  Stress: Not on file  Social Connections: Not on file  Intimate Partner Violence: Not on file     Current Outpatient Medications:  Marland Kitchen  Multiple Vitamin (MULTI-VITAMINS) TABS, Take 1 tablet by mouth daily. , Disp: , Rfl:  .  mupirocin ointment (BACTROBAN) 2 %, Apply 1 application topically daily. (Patient not taking: Reported on 09/07/2020), Disp: 22 g, Rfl: 2     ROS:  Review of Systems  Constitutional: Negative for fatigue, fever and unexpected weight change.  Respiratory: Negative for cough, shortness of breath and wheezing.   Cardiovascular: Negative for chest pain, palpitations and leg swelling.  Gastrointestinal: Negative for blood in stool, constipation, diarrhea, nausea and vomiting.  Endocrine: Negative for cold intolerance, heat intolerance and polyuria.  Genitourinary: Negative for dyspareunia, dysuria, flank  pain, frequency, genital sores, hematuria, menstrual problem, pelvic pain, urgency, vaginal bleeding, vaginal discharge and vaginal pain.  Musculoskeletal: Negative for back pain, joint swelling and myalgias.  Skin: Negative for rash.  Neurological: Negative for dizziness, syncope, light-headedness, numbness and headaches.  Hematological: Negative for adenopathy.  Psychiatric/Behavioral: Negative for agitation, confusion, sleep disturbance and suicidal ideas. The patient is not nervous/anxious.   BREAST: No symptoms    Objective: BP 130/80   Ht $R'5\' 3"'fk$  (1.6 m)   Wt 190 lb (86.2 kg)   BMI 33.66 kg/m    Physical Exam Constitutional:      Appearance: She is well-developed.  Genitourinary:     Vulva normal.     Genitourinary Comments:  UTERUS/CX SURG REM     Right Labia: No rash, tenderness or lesions.    Left Labia: No tenderness, lesions or rash.    Vaginal cuff intact.    No vaginal discharge, erythema or tenderness.      Right Adnexa: not tender and no mass present.    Left Adnexa: not tender and no mass present.    Cervix is absent.     Uterus is absent.  Breasts:     Right: No mass, nipple discharge, skin change or tenderness.     Left: No mass, nipple discharge, skin change or tenderness.    Neck:     Thyroid: No thyromegaly.  Cardiovascular:     Rate and Rhythm: Normal rate and regular rhythm.     Heart sounds: Normal heart sounds. No murmur heard.   Pulmonary:     Effort: Pulmonary effort is normal.     Breath sounds: Normal breath sounds.  Abdominal:     Palpations: Abdomen is soft.     Tenderness: There is no abdominal tenderness. There is no guarding.  Musculoskeletal:        General: Normal range of motion.     Cervical back: Normal range of motion.  Neurological:     General: No focal deficit present.     Mental Status: She is alert and oriented to person, place, and time.     Cranial Nerves: No cranial nerve deficit.  Skin:    General: Skin is warm  and dry.  Psychiatric:        Mood and Affect: Mood normal.        Behavior: Behavior normal.        Thought Content: Thought content normal.        Judgment: Judgment normal.  Vitals reviewed.     Assessment/Plan:  Encounter for annual routine gynecological examination  Cervical cancer screening - Plan: Cytology - PAP  Screening for HPV (human papillomavirus) - Plan: Cytology - PAP  Encounter for screening mammogram for malignant neoplasm of breast; pt current on mammo  Family history of breast cancer - Plan: Integrated [facility] ([place]); MyRisk testing discussed and done today. Will call with results.           GYN counsel breast self exam, mammography screening, menopause, adequate intake of calcium and vitamin D, diet and exercise    F/U  Return in about 1 year (around 09/07/2021).  Renso Swett B. Jame Seelig, PA-C 09/07/2020 2:48 PM

## 2020-09-07 NOTE — Patient Instructions (Signed)
I value your feedback and you entrusting us with your care. If you get a [facility] patient survey, I would appreciate you taking the time to let us know about your experience today. Thank you! ? ? ?

## 2020-09-09 LAB — CYTOLOGY - PAP
Comment: NEGATIVE
Diagnosis: NEGATIVE
High risk HPV: NEGATIVE

## 2020-09-13 ENCOUNTER — Other Ambulatory Visit: Payer: Self-pay

## 2020-09-19 ENCOUNTER — Encounter: Payer: Self-pay | Admitting: Obstetrics and Gynecology

## 2020-09-28 ENCOUNTER — Ambulatory Visit: Payer: 59 | Admitting: Podiatry

## 2020-10-14 ENCOUNTER — Encounter (HOSPITAL_COMMUNITY): Payer: Self-pay | Admitting: *Deleted

## 2020-11-01 ENCOUNTER — Telehealth: Payer: Self-pay | Admitting: Obstetrics and Gynecology

## 2020-11-01 NOTE — Telephone Encounter (Signed)
I LM for pt with neg results 09/26/20. Pt has also seen results on MyChart.  Spoke with her today to give more detailed info about results.   Pt is MyRisk neg; IBIS=14.8%/riskscore=11.5%.  Patient understands these results only apply to her and her children, and this is not indicative of genetic testing results of her other family members. It is recommended that her other family members have genetic testing done.  Pt also understands negative genetic testing doesn't mean she will never get any of these cancers.   Pt at increased risk of colon cancer based on FH. Current on colonoscopy.  Hard copy mailed to pt. F/u prn.

## 2020-11-21 ENCOUNTER — Other Ambulatory Visit: Payer: Self-pay

## 2020-11-21 MED ORDER — FLUCONAZOLE 150 MG PO TABS: 150.0000 mg | ORAL_TABLET | Freq: Once | ORAL | 2 refills | Status: AC

## 2020-11-29 DIAGNOSIS — H18413 Arcus senilis, bilateral: Secondary | ICD-10-CM | POA: Diagnosis not present

## 2020-11-29 DIAGNOSIS — H25042 Posterior subcapsular polar age-related cataract, left eye: Secondary | ICD-10-CM | POA: Diagnosis not present

## 2020-11-29 DIAGNOSIS — Z961 Presence of intraocular lens: Secondary | ICD-10-CM | POA: Diagnosis not present

## 2020-11-29 DIAGNOSIS — H2512 Age-related nuclear cataract, left eye: Secondary | ICD-10-CM | POA: Diagnosis not present

## 2020-12-07 ENCOUNTER — Ambulatory Visit: Payer: 59 | Admitting: Podiatry

## 2020-12-07 ENCOUNTER — Other Ambulatory Visit: Payer: Self-pay

## 2020-12-07 DIAGNOSIS — M6740 Ganglion, unspecified site: Secondary | ICD-10-CM | POA: Diagnosis not present

## 2020-12-10 ENCOUNTER — Encounter: Payer: Self-pay | Admitting: Podiatry

## 2020-12-10 NOTE — Progress Notes (Signed)
  Subjective:  Patient ID: Beverly Washington, female    DOB: 07/27/1965,  MRN: [id number]  Chief Complaint  Patient presents with   lesion     had surgery something white on foot    55 y.o. female presents with the above complaint. History confirmed with patient.  Small bump has appeared at the end of the incision and is causing pressure  Objective:  Physical Exam: warm, good capillary refill, no trophic changes or ulcerative lesions, normal DP and PT pulses, normal sensory exam, and small fluctuant mass at distal end of incision. Assessment:   1. Ganglion cyst      Plan:  Patient was evaluated and treated and all questions answered.  Mass.  Consistent with a ganglion cyst.  Could be secondary to her previous mass that was excised although that was a vascular malformation and not a ganglion.  Also possible is from the tendon sheath or joint adjacent to where her incision was closed.  I recommended drainage of the cyst today.  Following a digital block with 1.5 cc each of 0.5% Marcaine plain and lidocaine 2% plain, I aspirated the cyst using 18-gauge needle and syringe and injected 20 mg of Kenalog into the area.  She tolerated it well.  No follow-ups on file.

## 2021-02-27 ENCOUNTER — Other Ambulatory Visit: Payer: Self-pay | Admitting: *Deleted

## 2021-02-27 DIAGNOSIS — Z1231 Encounter for screening mammogram for malignant neoplasm of breast: Secondary | ICD-10-CM

## 2021-03-09 ENCOUNTER — Other Ambulatory Visit: Payer: Self-pay

## 2021-03-09 MED ORDER — HYDROCOD POLST-CPM POLST ER 10-8 MG/5ML PO SUER
ORAL | 0 refills | Status: DC
  Filled 2021-03-09: qty 150, 30d supply, fill #0

## 2021-03-15 ENCOUNTER — Other Ambulatory Visit: Payer: Self-pay

## 2021-03-15 MED ORDER — ONDANSETRON HCL 4 MG PO TABS
4.0000 mg | ORAL_TABLET | Freq: Three times a day (TID) | ORAL | 1 refills | Status: DC | PRN
  Filled 2021-03-15: qty 20, 7d supply, fill #0

## 2021-03-16 ENCOUNTER — Other Ambulatory Visit: Payer: Self-pay

## 2021-03-22 ENCOUNTER — Other Ambulatory Visit: Payer: Self-pay

## 2021-03-22 ENCOUNTER — Ambulatory Visit
Admission: RE | Admit: 2021-03-22 | Discharge: 2021-03-22 | Disposition: A | Discharge status: Home / Self Care | Payer: 59 | Source: Ambulatory Visit | Attending: Internal Medicine | Admitting: Internal Medicine

## 2021-03-22 DIAGNOSIS — Z1231 Encounter for screening mammogram for malignant neoplasm of breast: Secondary | ICD-10-CM | POA: Diagnosis not present

## 2021-04-04 ENCOUNTER — Ambulatory Visit (INDEPENDENT_AMBULATORY_CARE_PROVIDER_SITE_OTHER): Payer: 59 | Admitting: *Deleted

## 2021-04-04 DIAGNOSIS — Z23 Encounter for immunization: Secondary | ICD-10-CM | POA: Diagnosis not present

## 2021-05-12 ENCOUNTER — Other Ambulatory Visit: Payer: Self-pay | Admitting: *Deleted

## 2021-05-12 MED ORDER — TRAMADOL HCL 50 MG PO TABS: 50.0000 mg | ORAL_TABLET | Freq: Every day | ORAL | 1 refills | Status: DC

## 2021-06-28 ENCOUNTER — Ambulatory Visit: Payer: 59

## 2021-06-28 ENCOUNTER — Other Ambulatory Visit: Payer: Self-pay | Admitting: Podiatry

## 2021-06-28 ENCOUNTER — Ambulatory Visit (INDEPENDENT_AMBULATORY_CARE_PROVIDER_SITE_OTHER): Payer: 59 | Admitting: Podiatry

## 2021-06-28 ENCOUNTER — Encounter: Payer: Self-pay | Admitting: Podiatry

## 2021-06-28 DIAGNOSIS — Q279 Congenital malformation of peripheral vascular system, unspecified: Secondary | ICD-10-CM

## 2021-06-28 DIAGNOSIS — M6740 Ganglion, unspecified site: Secondary | ICD-10-CM

## 2021-06-30 NOTE — Progress Notes (Signed)
?  Subjective:  ?Patient ID: Beverly Washington, female    DOB: 03-29-1966,  MRN: [id number] ? ?Chief Complaint  ?Patient presents with  ? Foot Pain  ? ? ?56 y.o. female for follow-up with the above complaint. History confirmed with patient.  Feels like the mass is returning is becoming dark when she stands on her foot all day and sometimes painful ? ?Objective:  ?Physical Exam: ?warm, good capillary refill, no trophic changes or ulcerative lesions, normal DP and PT pulses, normal sensory exam, and area of blue discoloration at site of prior excision of mass painful to palpation here ? ?Radiographs taken today show slight narrowing of the interphalangeal joint and a small osteophyte in the area of concern ?Assessment:  ? ?1. Vascular malformation   ? ? ? ?Plan:  ?Patient was evaluated and treated and all questions answered. ? ?Unfortunate seems to be having possible recurrence of the lesion.  Also could be neuritis of the cutaneous nerve here as well.  Currently it is quite small and I do not think surgical excision again would be beneficial yet.  I recommended a corticosteroid injection, following ethyl chloride spray 4 mg of dexamethasone was injected into the area.  She tolerated this well.  There is a small osteophyte on her x-ray in the area of concern but I do not think this is significant contributing either. ? ?No follow-ups on file.  ? ? ?

## 2021-07-13 ENCOUNTER — Other Ambulatory Visit: Payer: Self-pay

## 2021-07-13 MED ORDER — FLUCONAZOLE 150 MG PO TABS: 150.0000 mg | ORAL_TABLET | Freq: Once | ORAL | 0 refills | Status: AC

## 2021-07-13 MED ORDER — AMOXICILLIN-POT CLAVULANATE 875-125 MG PO TABS: 1.0000 | ORAL_TABLET | Freq: Two times a day (BID) | ORAL | 0 refills | Status: DC

## 2021-07-27 ENCOUNTER — Other Ambulatory Visit: Payer: Self-pay | Admitting: Nurse Practitioner

## 2021-07-27 MED ORDER — MINOXIDIL 2.5 MG PO TABS: 2.5000 mg | ORAL_TABLET | Freq: Every day | ORAL | 2 refills | Status: DC

## 2021-07-28 ENCOUNTER — Other Ambulatory Visit: Payer: Self-pay

## 2021-07-28 MED ORDER — FINASTERIDE 5 MG PO TABS: 5.0000 mg | ORAL_TABLET | Freq: Every day | ORAL | 3 refills | Status: DC

## 2021-08-14 ENCOUNTER — Other Ambulatory Visit: Payer: Self-pay

## 2021-08-14 MED ORDER — MAGIC MOUTHWASH: 5.0000 mL | Freq: Every day | ORAL | 0 refills | Status: DC | PRN

## 2021-08-23 ENCOUNTER — Other Ambulatory Visit: Payer: Self-pay | Admitting: *Deleted

## 2021-08-23 ENCOUNTER — Ambulatory Visit (INDEPENDENT_AMBULATORY_CARE_PROVIDER_SITE_OTHER): Payer: 59 | Admitting: *Deleted

## 2021-08-23 ENCOUNTER — Other Ambulatory Visit: Payer: Self-pay

## 2021-08-23 DIAGNOSIS — L659 Nonscarring hair loss, unspecified: Secondary | ICD-10-CM | POA: Diagnosis not present

## 2021-08-23 MED ORDER — ALPRAZOLAM 0.25 MG PO TABS: 0.2500 mg | ORAL_TABLET | Freq: Two times a day (BID) | ORAL | 1 refills | Status: DC | PRN

## 2021-08-23 MED ORDER — ALPRAZOLAM 0.25 MG PO TABS
0.2500 mg | ORAL_TABLET | ORAL | 1 refills | Status: DC
Start: 2021-08-23 — End: 2021-11-24
  Filled 2021-08-23: qty 60, 30d supply, fill #0
  Filled 2021-10-21: qty 60, 30d supply, fill #1

## 2021-08-24 LAB — B12 AND FOLATE PANEL
Folate: >24 ng/mL
Vitamin B-12: 473 pg/mL (ref 200–1100)

## 2021-08-24 LAB — T4: T4, Total: 9.4 ug/dL (ref 5.1–11.9)

## 2021-08-24 LAB — TSH: TSH: 3.15 mIU/L (ref 0.40–4.50)

## 2021-08-24 LAB — T3, FREE: T3, Free: 3.4 pg/mL (ref 2.3–4.2)

## 2021-08-24 LAB — VITAMIN D 25 HYDROXY (VIT D DEFICIENCY, FRACTURES): Vit D, 25-Hydroxy: 40 ng/mL (ref 30–100)

## 2021-10-23 ENCOUNTER — Other Ambulatory Visit: Payer: Self-pay

## 2021-10-23 ENCOUNTER — Ambulatory Visit: Payer: 59 | Admitting: Podiatry

## 2021-10-30 ENCOUNTER — Other Ambulatory Visit: Payer: Self-pay

## 2021-10-30 MED ORDER — METHYLPREDNISOLONE 4 MG PO TBPK: ORAL_TABLET | ORAL | 0 refills | Status: DC

## 2021-11-17 ENCOUNTER — Encounter: Payer: 59 | Admitting: Nurse Practitioner

## 2021-11-24 ENCOUNTER — Encounter: Payer: Self-pay | Admitting: Nurse Practitioner

## 2021-11-24 ENCOUNTER — Ambulatory Visit (INDEPENDENT_AMBULATORY_CARE_PROVIDER_SITE_OTHER): Payer: 59 | Admitting: Nurse Practitioner

## 2021-11-24 VITALS — BP 132/80 | HR 67 | Ht 63.0 in | Wt 184.4 lb

## 2021-11-24 DIAGNOSIS — Z6832 Body mass index (BMI) 32.0-32.9, adult: Secondary | ICD-10-CM | POA: Diagnosis not present

## 2021-11-24 DIAGNOSIS — Z0000 Encounter for general adult medical examination without abnormal findings: Secondary | ICD-10-CM | POA: Insufficient documentation

## 2021-11-24 DIAGNOSIS — E669 Obesity, unspecified: Secondary | ICD-10-CM

## 2021-11-24 NOTE — Assessment & Plan Note (Addendum)
Encouraged patient to consume a balanced diet and regular exercise regimen. Advised to see an eye doctor annually.

## 2021-11-24 NOTE — Progress Notes (Signed)
Established Patient Office Visit  Subjective:  Patient ID: Beverly Washington, female    DOB: Dec 28, 1965  Age: 56 y.o. MRN: [id number]  CC: No chief complaint on file.    HPI  Beverly Washington  Patient presents to the clinic for her annual physical exam.She is overall doing fine. No new complaints at present. She has seasonal allergies.  She is only taking multivitamins,  no other medication at present  Flu: 2022  Tetanus:2021 COVID: [place] X2  Pap smear:2022 Dentist: every 40month Eye examination: 2022 Exercise: daily gym for an hour  Diet: Patient does not eat meat. Patient consumes fruits and veggies. Patient does not eat  fried food. Patient drinks water and coffee.   HPI   Past Medical History:  Diagnosis Date   BRCA negative 08/2020   MyRisk neg except MSH3 VUS   Colon polyps    Complication of anesthesia 2014   low bp with epidural   Coronary artery disease    Cardiac catheterization in February 2016 showed an 80% discrete mid LAD stenosis at the origin of first diagonal branch. The LAD was very small after the diagonal branch less than 2.5 mm. Ejection fraction was 65% with mildly elevated left ventricular end-diastolic pressure.   Family history of breast cancer 08/2020   IBIS=14.8%/riskscore=11.5%   Hyperlipidemia    Hypertension    Iritis    right eye   Pre-diabetes     Past Surgical History:  Procedure Laterality Date   abdominal plasty     BREAST REDUCTION SURGERY     [facility]  CARDIAC CATHETERIZATION Left 08/05/2015   Procedure: Left Heart Cath and Coronary Angiography;  Surgeon: MWellington Hampshire MD;  [facility];  Service: Cardiovascular;  Laterality: Left;   CATARACT EXTRACTION W/ INTRAOCULAR LENS IMPLANT Right    CESAREAN SECTION     x3   CHOLECYSTECTOMY     COLONOSCOPY WITH PROPOFOL N/A 03/21/2018   Procedure: COLONOSCOPY WITH PROPOFOL;  Surgeon: WLucilla Lame MD;  [place]  Service: Endoscopy;  Laterality: N/A;  Latex sensitivity   COLONOSCOPY WITH PROPOFOL N/A 10/02/2018   Procedure: COLONOSCOPY WITH BIOPSIES;  Surgeon: WLucilla Lame MD;  [place] [facility]  Service: Endoscopy;  Laterality: N/A;   EYE MUSCLE SURGERY     HERNIA REPAIR     LAPAROSCOPIC GASTRIC SLEEVE RESECTION N/A 03/19/2016   Procedure: LAPAROSCOPIC  LYSIS OF ADHESIONS, GASTRIC SLEEVE RESECTION, UPPER ENDO;  Surgeon: MJohnathan Hausen MD;  [facility];  Service: General;  Laterality: N/A;   LAPAROSCOPIC LYSIS OF ADHESIONS  03/19/2016   Procedure: LAPAROSCOPIC LYSIS OF ADHESIONS;  Surgeon: MJohnathan Hausen MD;  [facility];  Service: General;;   OOPHORECTOMY     right   POLYPECTOMY  03/21/2018   Procedure: POLYPECTOMY;  Surgeon: WLucilla Lame MD;  [place] [facility]  Service: Endoscopy;;   POLYPECTOMY N/A 10/02/2018   Procedure: POLYPECTOMY;  Surgeon: WLucilla Lame MD;  [place] [facility]  Service: Endoscopy;  Laterality: N/A;   REDUCTION MAMMAPLASTY Bilateral 2000   TOTAL ABDOMINAL HYSTERECTOMY     UPPER GI ENDOSCOPY  03/19/2016   Procedure: UPPER GI ENDOSCOPY;  Surgeon: MJohnathan Hausen MD;  [facility];  Service: General;;    Family History  Problem Relation Age of Onset   Heart disease Mother    Heart attack Mother    Pulmonary embolism Mother    Breast cancer Mother 420  Colon polyps Father    Heart attack Maternal Uncle    Heart disease Maternal Uncle    Heart attack Paternal Uncle    Heart attack Maternal Uncle    Heart disease Maternal Uncle    Heart attack Maternal Uncle    Heart disease Maternal Uncle    Heart attack Maternal Uncle    Heart disease Maternal Uncle    Heart attack Maternal Uncle    Heart disease Maternal Uncle    Heart attack Maternal Uncle    Heart disease Maternal Uncle    Colon cancer Paternal Uncle 61   Colon cancer Cousin 7    Social History   Socioeconomic History   Marital status: Divorced     Spouse name: Not on file   Number of children: Not on file   Years of education: Not on file   Highest education level: Not on file  Occupational History   Not on file  Tobacco Use   Smoking status: Never   Smokeless tobacco: Never  Vaping Use   Vaping Use: Never used  Substance and Sexual Activity   Alcohol use: No   Drug use: No   Sexual activity: Not Currently    Birth control/protection: Surgical    Comment: Hysterectomy  Other Topics Concern   Not on file  Social History Narrative   Not on file   Social Determinants of Health   Financial Resource Strain: Not on file  Food Insecurity: Not on file  Transportation Needs: Not on file  Physical Activity: Not on file  Stress: Not on file  Social Connections: Not on file  Intimate Partner Violence: Not on file     Outpatient Medications Prior to Visit  Medication Sig Dispense Refill   Multiple Vitamin (MULTI-VITAMINS) TABS Take 1 tablet by mouth daily.      ALPRAZolam (XANAX) 0.25 MG tablet Take 1 tablet (0.25 mg total) by mouth 2 (two) times daily as needed for anxiety. 60 tablet 1   ALPRAZolam (XANAX) 0.25 MG tablet Take 1 tablet by mouth two times daily as needed for anxiety 60 tablet 1   amoxicillin-clavulanate (AUGMENTIN) 875-125 MG tablet Take 1 tablet by mouth 2 (two) times daily. 20 tablet 0   chlorpheniramine-HYDROcodone (TUSSIONEX) 10-8 MG/5ML SUER Take 2.5 mls (1/2 teaspoonful) twice a day 150 mL 0   finasteride (PROSCAR) 5 MG tablet Take 1 tablet (5 mg total) by mouth daily. 30 tablet 3   magic mouthwash SOLN Take 5 mLs by mouth daily as needed for mouth pain. 10 mL 0   methylPREDNISolone (MEDROL DOSEPAK) 4 MG TBPK tablet Use as directed 21 tablet 0   minoxidil (LONITEN) 2.5 MG tablet Take 1 tablet (2.5 mg total) by mouth daily. 30 tablet 2   mupirocin ointment (BACTROBAN) 2 % Apply 1 application topically daily. 22 g 2   ondansetron (ZOFRAN) 4 MG tablet Take 1 tablet (4 mg total) by mouth every 8 (eight)  hours as needed for nausea or vomiting. 20 tablet 1   traMADol (ULTRAM) 50 MG tablet Take 1 tablet (50 mg total) by mouth daily at 2 PM. 30 tablet 1   No facility-administered medications prior to visit.    Allergies  Allergen Reactions   Ciprofloxacin Hcl Itching    Patient reports constant itching when taking cipro, but no rash.   Ceftriaxone Rash    rocephin   Hydromorphone Rash   Latex Rash    Swelling, (powdered gloves when worn and surgical tape)   Olmesartan  Palpitations    ROS Review of Systems  Constitutional: Negative.   HENT: Negative.    Eyes: Negative.   Respiratory: Negative.    Cardiovascular: Negative.   Gastrointestinal: Negative.   Genitourinary: Negative.   Musculoskeletal: Negative.   Neurological: Negative.   Hematological: Negative.   Psychiatric/Behavioral: Negative.        Objective:    Physical Exam Constitutional:      Appearance: Normal appearance. She is obese.  HENT:     Head: Normocephalic.     Right Ear: Tympanic membrane normal.     Left Ear: Tympanic membrane normal.     Nose: Nose normal.     Mouth/Throat:     Mouth: Mucous membranes are moist.     Pharynx: Oropharynx is clear.  Eyes:     Extraocular Movements: Extraocular movements intact.     Conjunctiva/sclera: Conjunctivae normal.     Pupils: Pupils are equal, round, and reactive to light.  Cardiovascular:     Rate and Rhythm: Normal rate and regular rhythm.     Pulses: Normal pulses.     Heart sounds: Normal heart sounds.  Pulmonary:     Effort: Pulmonary effort is normal. No respiratory distress.     Breath sounds: Normal breath sounds. No rhonchi.  Abdominal:     General: Bowel sounds are normal.     Palpations: Abdomen is soft. There is no mass.     Tenderness: There is no abdominal tenderness.     Hernia: No hernia is present.  Musculoskeletal:        General: Normal range of motion.     Cervical back: Neck supple. No tenderness.  Skin:    General: Skin is  warm.     Capillary Refill: Capillary refill takes less than 2 seconds.  Neurological:     General: No focal deficit present.     Mental Status: She is alert and oriented to person, place, and time. Mental status is at baseline.  Psychiatric:        Mood and Affect: Mood normal.        Behavior: Behavior normal.        Thought Content: Thought content normal.        Judgment: Judgment normal.     BP 132/80   Pulse 67   Ht $R'5\' 3"'Sw$  (1.6 m)   Wt 184 lb 7 oz (83.7 kg)   BMI 32.67 kg/m  Wt Readings from Last 3 Encounters:  11/24/21 184 lb 7 oz (83.7 kg)  09/07/20 190 lb (86.2 kg)  03/17/19 194 lb 6.4 oz (88.2 kg)     Health Maintenance  Topic Date Due   HIV Screening  Never done   INFLUENZA VACCINE  10/31/2021   Zoster Vaccines- Shingrix (1 of 2) 02/24/2022 (Originally 06/20/2014)   COVID-19 Vaccine (2 - Pfizer series) 10/29/2022 (Originally 03/12/2020)   Hepatitis C Screening  11/25/2022 (Originally 06/20/1982)   MAMMOGRAM  03/23/2023   COLONOSCOPY (Pts 45-53yrs Insurance coverage will need to be confirmed)  10/02/2023   PAP SMEAR-Modifier  09/07/2025   TETANUS/TDAP  09/30/2029   HPV VACCINES  Aged Out    There are no preventive care reminders to display for this patient.  Lab Results  Component Value Date   TSH 3.15 08/23/2021   Lab Results  Component Value Date   WBC 15.6 (H) 03/20/2016   HGB 14.2 03/20/2016   HCT 41.4 03/20/2016   MCV 90.8 03/20/2016   PLT 249 03/20/2016   Lab  Results  Component Value Date   NA 139 03/16/2016   K 4.5 03/16/2016   CO2 24 03/16/2016   GLUCOSE 118 (H) 03/16/2016   BUN 15 03/16/2016   CREATININE 0.74 03/19/2016   BILITOT 0.6 03/16/2016   ALKPHOS 63 03/16/2016   AST 74 (H) 03/16/2016   ALT 113 (H) 03/16/2016   PROT 7.6 03/16/2016   ALBUMIN 4.3 03/16/2016   CALCIUM 9.1 03/16/2016   ANIONGAP 6 03/16/2016   No results found for: "CHOL" No results found for: "HDL" No results found for: "LDLCALC" No results found for:  "TRIG" No results found for: "CHOLHDL" Lab Results  Component Value Date   HGBA1C 6.2 (H) 03/16/2016      Assessment & Plan:   Problem List Items Addressed This Visit       Other   Class 1 obesity without serious comorbidity with body mass index (BMI) of 32.0 to 32.9 in adult    Body mass index is 32.67 kg/m. Advised pt to lose weight. Advised patient to avoid trans fat, fatty and fried food. Follow a regular physical activity schedule.         Annual physical exam - Primary    Encouraged patient to consume a balanced diet and regular exercise regimen. Advised to see an eye doctor annually.             No orders of the defined types were placed in this encounter.    Follow-up: Return in about 1 year (around 11/25/2022) for annual physical.    Theresia Lo, NP

## 2021-11-24 NOTE — Assessment & Plan Note (Signed)
Body mass index is 32.67 kg/m. Advised pt to lose weight. Advised patient to avoid trans fat, fatty and fried food. Follow a regular physical activity schedule.

## 2021-12-07 ENCOUNTER — Other Ambulatory Visit: Payer: Self-pay

## 2021-12-07 ENCOUNTER — Other Ambulatory Visit: Payer: Self-pay | Admitting: Nurse Practitioner

## 2021-12-07 MED ORDER — DOXYCYCLINE HYCLATE 100 MG PO TABS
100.0000 mg | ORAL_TABLET | Freq: Two times a day (BID) | ORAL | 0 refills | Status: DC
  Filled 2021-12-07: qty 20, 10d supply, fill #0

## 2021-12-07 MED ORDER — CIPROFLOXACIN-DEXAMETHASONE 0.3-0.1 % OT SUSP
4.0000 [drp] | Freq: Two times a day (BID) | OTIC | 0 refills | Status: DC
  Filled 2021-12-07: qty 7.5, 7d supply, fill #0

## 2021-12-18 ENCOUNTER — Other Ambulatory Visit: Payer: Self-pay | Admitting: *Deleted

## 2021-12-18 ENCOUNTER — Other Ambulatory Visit: Payer: Self-pay

## 2021-12-18 MED ORDER — TOBRAMYCIN 0.3 % OP SOLN
1.0000 [drp] | Freq: Two times a day (BID) | OPHTHALMIC | 0 refills | Status: AC
  Filled 2021-12-18: qty 5, 10d supply, fill #0

## 2022-01-22 ENCOUNTER — Other Ambulatory Visit (HOSPITAL_COMMUNITY): Payer: Self-pay

## 2022-02-21 ENCOUNTER — Other Ambulatory Visit: Payer: Self-pay

## 2022-02-21 ENCOUNTER — Other Ambulatory Visit: Payer: Self-pay | Admitting: Nurse Practitioner

## 2022-02-21 MED ORDER — ACYCLOVIR 800 MG PO TABS: 800.0000 mg | ORAL_TABLET | Freq: Every day | ORAL | 0 refills | Status: AC

## 2022-02-21 MED ORDER — CLOTRIMAZOLE-BETAMETHASONE 1-0.05 % EX CREA: 1.0000 | TOPICAL_CREAM | Freq: Every day | CUTANEOUS | 0 refills | Status: DC

## 2022-03-15 ENCOUNTER — Other Ambulatory Visit: Payer: Self-pay | Admitting: Nurse Practitioner

## 2022-03-15 ENCOUNTER — Other Ambulatory Visit: Payer: Self-pay

## 2022-03-15 MED ORDER — AZITHROMYCIN 250 MG PO TABS
ORAL_TABLET | ORAL | 0 refills | Status: AC
  Filled 2022-03-15: qty 6, 5d supply, fill #0

## 2022-07-28 ENCOUNTER — Other Ambulatory Visit: Payer: Self-pay | Admitting: Nurse Practitioner

## 2022-07-28 MED ORDER — MUPIROCIN 2 % EX OINT: 1.0000 | TOPICAL_OINTMENT | Freq: Two times a day (BID) | CUTANEOUS | 2 refills | Status: AC

## 2022-07-28 MED ORDER — CYCLOBENZAPRINE HCL 10 MG PO TABS: 10.0000 mg | ORAL_TABLET | Freq: Every evening | ORAL | 0 refills | Status: AC | PRN

## 2022-07-28 NOTE — Addendum Note (Signed)
Addended by: Sallyanne Kuster on: 07/28/2022 08:14 AM   Modules accepted: Orders

## 2022-08-04 ENCOUNTER — Other Ambulatory Visit: Payer: Self-pay | Admitting: Nurse Practitioner

## 2022-08-04 MED ORDER — SULFAMETHOXAZOLE-TRIMETHOPRIM 800-160 MG PO TABS: 1.0000 | ORAL_TABLET | Freq: Two times a day (BID) | ORAL | 0 refills | Status: AC

## 2022-10-26 ENCOUNTER — Other Ambulatory Visit: Payer: Self-pay | Admitting: Internal Medicine

## 2022-10-26 DIAGNOSIS — Z1231 Encounter for screening mammogram for malignant neoplasm of breast: Secondary | ICD-10-CM

## 2022-11-02 ENCOUNTER — Ambulatory Visit
Admission: RE | Admit: 2022-11-02 | Discharge: 2022-11-02 | Disposition: A | Discharge status: Home / Self Care | Payer: 59 | Source: Ambulatory Visit | Attending: Internal Medicine | Admitting: Internal Medicine

## 2022-11-02 DIAGNOSIS — Z1231 Encounter for screening mammogram for malignant neoplasm of breast: Secondary | ICD-10-CM | POA: Diagnosis present

## 2023-01-07 ENCOUNTER — Ambulatory Visit (INDEPENDENT_AMBULATORY_CARE_PROVIDER_SITE_OTHER): Payer: 59

## 2023-01-07 DIAGNOSIS — Z23 Encounter for immunization: Secondary | ICD-10-CM

## 2023-02-25 ENCOUNTER — Other Ambulatory Visit: Payer: Self-pay

## 2023-02-25 MED ORDER — DOXYCYCLINE HYCLATE 100 MG PO TABS: 100.0000 mg | ORAL_TABLET | Freq: Two times a day (BID) | ORAL | 0 refills | Status: DC

## 2023-09-23 ENCOUNTER — Ambulatory Visit: Payer: Self-pay

## 2023-12-27 NOTE — Progress Notes (Signed)
 This encounter was created in error - please disregard.

## 2024-01-07 ENCOUNTER — Other Ambulatory Visit: Payer: Self-pay

## 2024-01-07 ENCOUNTER — Other Ambulatory Visit: Payer: Self-pay | Admitting: Nurse Practitioner

## 2024-01-07 MED ORDER — AZITHROMYCIN 250 MG PO TABS
ORAL_TABLET | ORAL | 0 refills | Status: AC
Start: 2024-01-07 — End: 2024-01-12
  Filled 2024-01-07: qty 6, 5d supply, fill #0
